# Patient Record
Sex: Female | Born: 1965 | Race: White | Hispanic: No | Marital: Married | Smoking: Never smoker
Health system: Southern US, Community
[De-identification: ages and names within clinical notes are randomized; demographics above are authoritative.]

## PROBLEM LIST (undated history)

## (undated) DIAGNOSIS — Z30430 Encounter for insertion of intrauterine contraceptive device: Secondary | ICD-10-CM

## (undated) DIAGNOSIS — E785 Hyperlipidemia, unspecified: Secondary | ICD-10-CM

## (undated) DIAGNOSIS — T7840XA Allergy, unspecified, initial encounter: Secondary | ICD-10-CM

## (undated) DIAGNOSIS — IMO0002 Reserved for concepts with insufficient information to code with codable children: Secondary | ICD-10-CM

## (undated) DIAGNOSIS — N200 Calculus of kidney: Secondary | ICD-10-CM

## (undated) HISTORY — DX: Calculus of kidney: N20.0

## (undated) HISTORY — DX: Encounter for insertion of intrauterine contraceptive device: Z30.430

## (undated) HISTORY — DX: Reserved for concepts with insufficient information to code with codable children: IMO0002

## (undated) HISTORY — PX: EYE SURGERY: SHX253

## (undated) HISTORY — DX: Hyperlipidemia, unspecified: E78.5

## (undated) HISTORY — DX: Allergy, unspecified, initial encounter: T78.40XA

## (undated) HISTORY — PX: REDUCTION MAMMAPLASTY: SUR839

## (undated) HISTORY — PX: BREAST SURGERY: SHX581

---

## 1988-05-26 DIAGNOSIS — IMO0002 Reserved for concepts with insufficient information to code with codable children: Secondary | ICD-10-CM

## 1988-05-26 HISTORY — DX: Reserved for concepts with insufficient information to code with codable children: IMO0002

## 1988-05-26 HISTORY — PX: COLPOSCOPY: SHX161

## 1997-10-17 ENCOUNTER — Other Ambulatory Visit: Admission: RE | Admit: 1997-10-17 | Discharge: 1997-10-17 | Payer: Self-pay | Admitting: Gynecology

## 1997-11-15 ENCOUNTER — Encounter: Admission: RE | Admit: 1997-11-15 | Discharge: 1998-02-13 | Payer: Self-pay | Admitting: Gynecology

## 1998-03-21 ENCOUNTER — Encounter: Admission: RE | Admit: 1998-03-21 | Discharge: 1998-06-19 | Payer: Self-pay | Admitting: Gynecology

## 1998-05-23 ENCOUNTER — Inpatient Hospital Stay (HOSPITAL_COMMUNITY): Admission: AD | Admit: 1998-05-23 | Discharge: 1998-05-26 | Payer: Self-pay | Admitting: Gynecology

## 1998-07-11 ENCOUNTER — Other Ambulatory Visit: Admission: RE | Admit: 1998-07-11 | Discharge: 1998-07-11 | Payer: Self-pay | Admitting: Gynecology

## 1999-08-27 ENCOUNTER — Other Ambulatory Visit: Admission: RE | Admit: 1999-08-27 | Discharge: 1999-08-27 | Payer: Self-pay | Admitting: Gynecology

## 2001-08-25 ENCOUNTER — Other Ambulatory Visit: Admission: RE | Admit: 2001-08-25 | Discharge: 2001-08-25 | Payer: Self-pay | Admitting: Gynecology

## 2002-11-07 ENCOUNTER — Other Ambulatory Visit: Admission: RE | Admit: 2002-11-07 | Discharge: 2002-11-07 | Payer: Self-pay | Admitting: Gynecology

## 2003-05-30 ENCOUNTER — Inpatient Hospital Stay (HOSPITAL_COMMUNITY): Admission: AD | Admit: 2003-05-30 | Discharge: 2003-06-02 | Payer: Self-pay | Admitting: Gynecology

## 2003-05-31 ENCOUNTER — Encounter (INDEPENDENT_AMBULATORY_CARE_PROVIDER_SITE_OTHER): Payer: Self-pay | Admitting: *Deleted

## 2003-07-28 ENCOUNTER — Other Ambulatory Visit: Admission: RE | Admit: 2003-07-28 | Discharge: 2003-07-28 | Payer: Self-pay | Admitting: Gynecology

## 2004-07-08 ENCOUNTER — Emergency Department (HOSPITAL_COMMUNITY): Admission: EM | Admit: 2004-07-08 | Discharge: 2004-07-08 | Payer: Self-pay | Admitting: Emergency Medicine

## 2004-12-02 ENCOUNTER — Other Ambulatory Visit: Admission: RE | Admit: 2004-12-02 | Discharge: 2004-12-02 | Payer: Self-pay | Admitting: Gynecology

## 2007-10-05 ENCOUNTER — Other Ambulatory Visit: Admission: RE | Admit: 2007-10-05 | Discharge: 2007-10-05 | Payer: Self-pay | Admitting: Gynecology

## 2010-04-11 ENCOUNTER — Ambulatory Visit: Payer: Self-pay | Admitting: Gynecology

## 2010-04-11 ENCOUNTER — Other Ambulatory Visit: Admission: RE | Admit: 2010-04-11 | Discharge: 2010-04-11 | Payer: Self-pay | Admitting: Gynecology

## 2010-04-24 ENCOUNTER — Ambulatory Visit: Payer: Self-pay | Admitting: Gynecology

## 2010-05-16 ENCOUNTER — Ambulatory Visit: Payer: Self-pay | Admitting: Gynecology

## 2010-06-16 ENCOUNTER — Encounter: Payer: Self-pay | Admitting: Gynecology

## 2010-06-18 ENCOUNTER — Ambulatory Visit: Admit: 2010-06-18 | Payer: Self-pay | Admitting: Gynecology

## 2010-07-09 ENCOUNTER — Other Ambulatory Visit: Payer: Self-pay | Admitting: Gynecology

## 2010-07-09 DIAGNOSIS — Z1231 Encounter for screening mammogram for malignant neoplasm of breast: Secondary | ICD-10-CM

## 2010-07-16 ENCOUNTER — Ambulatory Visit
Admission: RE | Admit: 2010-07-16 | Discharge: 2010-07-16 | Disposition: A | Discharge status: Home / Self Care | Payer: BC Managed Care – PPO | Source: Ambulatory Visit | Attending: Gynecology | Admitting: Gynecology

## 2010-07-16 DIAGNOSIS — Z1231 Encounter for screening mammogram for malignant neoplasm of breast: Secondary | ICD-10-CM

## 2010-10-11 NOTE — Discharge Summary (Signed)
Diana Walsh, Diana Walsh                        ACCOUNT NO.:  [id number]   MEDICAL RECORD NO.:  [id number]                   PATIENT TYPE:  INP   LOCATION:  9138                                 FACILITY:  [facility]   PHYSICIAN:  Ivor Costa. Farrel Gobble, M.D.              DATE OF BIRTH:  [birth year]   DATE OF ADMISSION:  05/30/2003  DATE OF DISCHARGE:  06/02/2003                                 DISCHARGE SUMMARY   DISCHARGE DIAGNOSES:  1. Intrauterine pregnancy at 38-and-a-half weeks, delivered.  2. Status post spontaneous vaginal delivery.  3. Advanced maternal age.   HISTORY:  This is a 36-years-of-age female gravida 2 para 1 with an EDC of  June 13, 2003.  Prenatal course had only been complicated by advanced  maternal age and subsequently underwent genetic amniocentesis which reveled  normal chromosomes.   HOSPITAL COURSE:  On May 31, 2003 the patient was admitted at 38+ weeks  in labor.  Artificial rupture of membranes was performed which revealed  meconium.  On May 31, 2003 the patient subsequently underwent a  spontaneous vaginal delivery of a female, Apgars of 8 and 9, weight of 6  pounds 8 ounces, DeLee suctioned at perineum secondary to meconium.  There  was a midline episiotomy which was repaired.  There were no complications.  Postpartum the patient remained afebrile, voiding, and in stable condition,  and on June 02, 2003 the patient was discharged to home in satisfactory  condition and given [facility] Gynecology postpartum instructions.   ACCESSORY CLINICAL FINDINGS/LABORATORY DATA:  The patient is O positive,  rubella immune.  On June 01, 2003 hemoglobin was 9.2.   DISPOSITION:  The patient is discharged to home.  Informed to return to the  office in 6 weeks; if any problem prior to that time to be seen in the  office.     Susa Loffler, P.A.                    Ivor Costa. Farrel Gobble, M.D.    [name]  D:  07/03/2003  T:  07/03/2003  Job:  [id number]

## 2011-12-11 ENCOUNTER — Encounter: Payer: Self-pay | Admitting: Gynecology

## 2011-12-11 DIAGNOSIS — N200 Calculus of kidney: Secondary | ICD-10-CM | POA: Insufficient documentation

## 2011-12-16 ENCOUNTER — Other Ambulatory Visit: Payer: Self-pay | Admitting: Gynecology

## 2011-12-16 DIAGNOSIS — Z1231 Encounter for screening mammogram for malignant neoplasm of breast: Secondary | ICD-10-CM

## 2011-12-18 ENCOUNTER — Other Ambulatory Visit (HOSPITAL_COMMUNITY)
Admission: RE | Admit: 2011-12-18 | Discharge: 2011-12-18 | Disposition: A | Discharge status: Home / Self Care | Payer: BC Managed Care – PPO | Source: Ambulatory Visit | Attending: Gynecology | Admitting: Gynecology

## 2011-12-18 ENCOUNTER — Encounter: Payer: Self-pay | Admitting: Gynecology

## 2011-12-18 ENCOUNTER — Ambulatory Visit (INDEPENDENT_AMBULATORY_CARE_PROVIDER_SITE_OTHER): Payer: BC Managed Care – PPO | Admitting: Gynecology

## 2011-12-18 VITALS — BP 110/70 | Ht 63.0 in | Wt 162.0 lb

## 2011-12-18 DIAGNOSIS — Z1151 Encounter for screening for human papillomavirus (HPV): Secondary | ICD-10-CM | POA: Insufficient documentation

## 2011-12-18 DIAGNOSIS — N879 Dysplasia of cervix uteri, unspecified: Secondary | ICD-10-CM | POA: Insufficient documentation

## 2011-12-18 DIAGNOSIS — Z131 Encounter for screening for diabetes mellitus: Secondary | ICD-10-CM

## 2011-12-18 DIAGNOSIS — Z01419 Encounter for gynecological examination (general) (routine) without abnormal findings: Secondary | ICD-10-CM | POA: Insufficient documentation

## 2011-12-18 DIAGNOSIS — Z13220 Encounter for screening for lipoid disorders: Secondary | ICD-10-CM

## 2011-12-18 DIAGNOSIS — R638 Other symptoms and signs concerning food and fluid intake: Secondary | ICD-10-CM

## 2011-12-18 LAB — CBC WITH DIFFERENTIAL/PLATELET
HCT: 41.4 % (ref 36.0–46.0)
Lymphocytes Relative: 25 % (ref 12–46)
MCV: 91.4 fL (ref 78.0–100.0)
Monocytes Absolute: 0.5 10*3/uL (ref 0.1–1.0)
Monocytes Relative: 6 % (ref 3–12)
Neutro Abs: 5 10*3/uL (ref 1.7–7.7)
Neutrophils Relative %: 65 % (ref 43–77)
Platelets: 332 10*3/uL (ref 150–400)
RBC: 4.53 MIL/uL (ref 3.87–5.11)
WBC: 7.6 10*3/uL (ref 4.0–10.5)

## 2011-12-18 LAB — TSH: TSH: 2.468 u[IU]/mL (ref 0.350–4.500)

## 2011-12-18 LAB — LIPID PANEL
HDL: 52 mg/dL (ref >39)
Triglycerides: 152 mg/dL — ABNORMAL HIGH (ref <150)

## 2011-12-18 LAB — GLUCOSE, RANDOM: Glucose, Bld: 77 mg/dL (ref 70–99)

## 2011-12-18 NOTE — Progress Notes (Signed)
Diana Walsh 1965/08/18 [id number]        46 y.o.  G2P2002 for annual exam.  Doing well no complaints.  Past medical history,surgical history, medications, allergies, family history and social history were all reviewed and documented in the EPIC chart. ROS:  Was performed and pertinent positives and negatives are included in the history.  Exam: Kim assistant Filed Vitals:   12/18/11 0850  BP: 110/70  Height: 5\' 3"  (1.6 m)  Weight: 162 lb (73.483 kg)   General appearance  Normal Skin grossly normal Head/Neck normal with no cervical or supraclavicular adenopathy thyroid normal Lungs  clear Cardiac RR, without RMG Abdominal  soft, nontender, without masses, organomegaly or hernia Breasts  examined lying and sitting without masses, retractions, discharge or axillary adenopathy.  Bilateral well-healed reduction scars. Pelvic  Ext/BUS/vagina  normal   Cervix  normal Pap/HPV, IUD string visualized  Uterus  anteverted, normal size, shape and contour, midline and mobile nontender   Adnexa  Without masses or tenderness    Anus and perineum  normal   Rectovaginal  normal sphincter tone without palpated masses or tenderness.    Assessment/Plan:  46 y.o. [id number] female for annual exam.   1. IUD. Mirena placed 04/2010.  Doing well with scant to absent menses. We'll continue to monitor. 2. Mammography. Patient has scheduled this week. We'll continue with annual mammography. SBE monthly reviewed. 3. Pap smear. History of low-grade SIL 1990. No treatment with normal Pap smears since. Last Pap smear 2011. Pap/HPV done today. Assuming negative and plan every 5 year screening. 4. Weight. Patient complaining of unable to lose weight despite exercise. We'll check TSH at her request. Diet and exercise issues reviewed. 5. Health maintenance. Baseline CBC lipid profile glucose urinalysis ordered. Follow up in one year, sooner as needed.    Dara Lords MD, 9:17 AM 12/18/2011

## 2011-12-18 NOTE — Patient Instructions (Signed)
Office will call you with lab results. Follow up in one year for annual gynecologic exam.

## 2011-12-18 NOTE — Addendum Note (Signed)
Addended by: Dayna Barker on: 12/18/2011 09:23 AM   Modules accepted: Orders

## 2011-12-19 ENCOUNTER — Other Ambulatory Visit: Payer: Self-pay | Admitting: Gynecology

## 2011-12-19 DIAGNOSIS — E7800 Pure hypercholesterolemia, unspecified: Secondary | ICD-10-CM

## 2011-12-19 LAB — URINALYSIS W MICROSCOPIC + REFLEX CULTURE
Bilirubin Urine: NEGATIVE
Casts: NONE SEEN
Hgb urine dipstick: NEGATIVE
Ketones, ur: NEGATIVE mg/dL
Nitrite: NEGATIVE
Protein, ur: NEGATIVE mg/dL

## 2011-12-20 LAB — URINE CULTURE
Colony Count: NO GROWTH
Organism ID, Bacteria: NO GROWTH

## 2011-12-29 ENCOUNTER — Ambulatory Visit: Payer: BC Managed Care – PPO

## 2012-01-02 ENCOUNTER — Ambulatory Visit
Admission: RE | Admit: 2012-01-02 | Discharge: 2012-01-02 | Disposition: A | Discharge status: Home / Self Care | Payer: BC Managed Care – PPO | Source: Ambulatory Visit | Attending: Gynecology | Admitting: Gynecology

## 2012-01-02 DIAGNOSIS — Z1231 Encounter for screening mammogram for malignant neoplasm of breast: Secondary | ICD-10-CM

## 2012-09-11 ENCOUNTER — Ambulatory Visit (INDEPENDENT_AMBULATORY_CARE_PROVIDER_SITE_OTHER): Payer: BC Managed Care – PPO | Admitting: Emergency Medicine

## 2012-09-11 VITALS — BP 122/78 | HR 85 | Temp 98.9°F | Resp 16 | Ht 63.4 in | Wt 160.2 lb

## 2012-09-11 DIAGNOSIS — T148 Other injury of unspecified body region: Secondary | ICD-10-CM

## 2012-09-11 DIAGNOSIS — L0390 Cellulitis, unspecified: Secondary | ICD-10-CM

## 2012-09-11 DIAGNOSIS — L0291 Cutaneous abscess, unspecified: Secondary | ICD-10-CM

## 2012-09-11 DIAGNOSIS — W57XXXA Bitten or stung by nonvenomous insect and other nonvenomous arthropods, initial encounter: Secondary | ICD-10-CM

## 2012-09-11 LAB — POCT CBC
Hemoglobin: 14 g/dL (ref 12.2–16.2)
Lymph, poc: 2 (ref 0.6–3.4)
MCH, POC: 29.6 pg (ref 27–31.2)
Platelet Count, POC: 358 10*3/uL (ref 142–424)
RBC: 4.73 M/uL (ref 4.04–5.48)
RDW, POC: 13 %

## 2012-09-11 MED ORDER — DOXYCYCLINE HYCLATE 100 MG PO CAPS: 100.0000 mg | ORAL_CAPSULE | Freq: Two times a day (BID) | ORAL | Status: DC

## 2012-09-11 MED ORDER — MUPIROCIN 2 % EX OINT: TOPICAL_OINTMENT | Freq: Three times a day (TID) | CUTANEOUS | Status: DC

## 2012-09-11 NOTE — Patient Instructions (Signed)
Cellulitis Cellulitis is an infection of the skin and the tissue beneath it. The infected area is usually red and tender. Cellulitis occurs most often in the arms and lower legs.   CAUSES   Cellulitis is caused by bacteria that enter the skin through cracks or cuts in the skin. The most common types of bacteria that cause cellulitis are Staphylococcus and Streptococcus. SYMPTOMS    Redness and warmth.   Swelling.   Tenderness or pain.   Fever.  DIAGNOSIS  Your caregiver can usually determine what is wrong based on a physical exam. Blood tests may also be done. TREATMENT   Treatment usually involves taking an antibiotic medicine. HOME CARE INSTRUCTIONS    Take your antibiotics as directed. Finish them even if you start to feel better.   Keep the infected arm or leg elevated to reduce swelling.   Apply a warm cloth to the affected area up to 4 times per day to relieve pain.   Only take over-the-counter or prescription medicines for pain, discomfort, or fever as directed by your caregiver.   Keep all follow-up appointments as directed by your caregiver.  SEEK MEDICAL CARE IF:    You notice red streaks coming from the infected area.   Your red area gets larger or turns dark in color.   Your bone or joint underneath the infected area becomes painful after the skin has healed.   Your infection returns in the same area or another area.   You notice a swollen bump in the infected area.   You develop new symptoms.  SEEK IMMEDIATE MEDICAL CARE IF:    You have a fever.   You feel very sleepy.   You develop vomiting or diarrhea.   You have a general ill feeling (malaise) with muscle aches and pains.  MAKE SURE YOU:    Understand these instructions.   Will watch your condition.   Will get help right away if you are not doing well or get worse.  Document Released: 02/19/2005 Document Revised: 11/11/2011 Document Reviewed: 07/28/2011 [employer] Patient Information 2013  [employer].    

## 2012-09-11 NOTE — Progress Notes (Signed)
  Subjective:    Patient ID: Diana Walsh, female    DOB: 30-Dec-1965, 47 y.o.   MRN: [id number]  HPI Pt complains of tick bite on back of left leg. She states she pulled a tick off of her. Today she noticed the area where she removed the tick to be red swollen with blackish material in the center of the    Review of Systems     Objective:   Physical Exam there are 2 abnormal areas just on the underside of the left buttocks. One measures 1 x 0.5 cm the other is about a half by three-quarter centimeter and both are surrounded by a red edematous skin  Results for orders placed in visit on 09/11/12  POCT CBC      Result Value Range   WBC 7.3  4.6 - 10.2 K/uL   Lymph, poc 2.0  0.6 - 3.4   POC LYMPH PERCENT 27.5  10 - 50 %L   MID (cbc) 0.6  0 - 0.9   POC MID % 8.2  0 - 12 %M   POC Granulocyte 4.7  2 - 6.9   Granulocyte percent 64.3  37 - 80 %G   RBC 4.73  4.04 - 5.48 M/uL   Hemoglobin 14.0  12.2 - 16.2 g/dL   HCT, POC 40.9  81.1 - 47.9 %   MCV 93.7  80 - 97 fL   MCH, POC 29.6  27 - 31.2 pg   MCHC 31.6 (*) 31.8 - 35.4 g/dL   RDW, POC 91.4     Platelet Count, POC 358  142 - 424 K/uL   MPV 8.3  0 - 99.8 fL        Assessment & Plan:  The history says this was the area where she removed an engorged tick. The areas looked like spider bites or areas of MRSA. We'll treat the areas with soap and water cleaning as well as Bactroban and doxycycline.

## 2012-09-13 LAB — WOUND CULTURE
Gram Stain: NONE SEEN
Gram Stain: NONE SEEN
Organism ID, Bacteria: NO GROWTH

## 2013-01-03 ENCOUNTER — Other Ambulatory Visit: Payer: Self-pay

## 2013-01-03 DIAGNOSIS — Z1231 Encounter for screening mammogram for malignant neoplasm of breast: Secondary | ICD-10-CM

## 2013-01-20 ENCOUNTER — Ambulatory Visit: Payer: BC Managed Care – PPO

## 2013-01-21 ENCOUNTER — Ambulatory Visit: Payer: BC Managed Care – PPO

## 2013-01-28 ENCOUNTER — Ambulatory Visit
Admission: RE | Admit: 2013-01-28 | Discharge: 2013-01-28 | Disposition: A | Discharge status: Home / Self Care | Payer: BC Managed Care – PPO | Source: Ambulatory Visit

## 2013-01-28 DIAGNOSIS — Z1231 Encounter for screening mammogram for malignant neoplasm of breast: Secondary | ICD-10-CM

## 2013-05-02 ENCOUNTER — Ambulatory Visit (INDEPENDENT_AMBULATORY_CARE_PROVIDER_SITE_OTHER): Payer: BC Managed Care – PPO | Admitting: Emergency Medicine

## 2013-05-02 VITALS — BP 118/76 | HR 78 | Temp 98.0°F | Resp 16 | Ht 62.0 in | Wt 167.0 lb

## 2013-05-02 DIAGNOSIS — H60332 Swimmer's ear, left ear: Secondary | ICD-10-CM

## 2013-05-02 DIAGNOSIS — H6120 Impacted cerumen, unspecified ear: Secondary | ICD-10-CM

## 2013-05-02 DIAGNOSIS — H6122 Impacted cerumen, left ear: Secondary | ICD-10-CM

## 2013-05-02 DIAGNOSIS — H60339 Swimmer's ear, unspecified ear: Secondary | ICD-10-CM

## 2013-05-02 MED ORDER — CIPROFLOXACIN-HYDROCORTISONE 0.2-1 % OT SUSP: 3.0000 [drp] | Freq: Two times a day (BID) | OTIC | Status: DC

## 2013-05-02 NOTE — Patient Instructions (Signed)
Otitis Externa Otitis externa is a bacterial or fungal infection of the outer ear canal. This is the area from the eardrum to the outside of the ear. Otitis externa is sometimes called "swimmer's ear." CAUSES  Possible causes of infection include:  Swimming in dirty water.  Moisture remaining in the ear after swimming or bathing.  Mild injury (trauma) to the ear.  Objects stuck in the ear (foreign body).  Cuts or scrapes (abrasions) on the outside of the ear. SYMPTOMS  The first symptom of infection is often itching in the ear canal. Later signs and symptoms may include swelling and redness of the ear canal, ear pain, and yellowish-white fluid (pus) coming from the ear. The ear pain may be worse when pulling on the earlobe. DIAGNOSIS  Your caregiver will perform a physical exam. A sample of fluid may be taken from the ear and examined for bacteria or fungi. TREATMENT  Antibiotic ear drops are often given for 10 to 14 days. Treatment may also include pain medicine or corticosteroids to reduce itching and swelling. PREVENTION   Keep your ear dry. Use the corner of a towel to absorb water out of the ear canal after swimming or bathing.  Avoid scratching or putting objects inside your ear. This can damage the ear canal or remove the protective wax that lines the canal. This makes it easier for bacteria and fungi to grow.  Avoid swimming in lakes, polluted water, or poorly chlorinated pools.  You may use ear drops made of rubbing alcohol and vinegar after swimming. Combine equal parts of white vinegar and alcohol in a bottle. Put 3 or 4 drops into each ear after swimming. HOME CARE INSTRUCTIONS   Apply antibiotic ear drops to the ear canal as prescribed by your caregiver.  Only take over-the-counter or prescription medicines for pain, discomfort, or fever as directed by your caregiver.  If you have diabetes, follow any additional treatment instructions from your caregiver.  Keep all  follow-up appointments as directed by your caregiver. SEEK MEDICAL CARE IF:   You have a fever.  Your ear is still red, swollen, painful, or draining pus after 3 days.  Your redness, swelling, or pain gets worse.  You have a severe headache.  You have redness, swelling, pain, or tenderness in the area behind your ear. MAKE SURE YOU:   Understand these instructions.  Will watch your condition.  Will get help right away if you are not doing well or get worse. Document Released: 05/12/2005 Document Revised: 08/04/2011 Document Reviewed: 05/29/2011 [employer] Patient Information 2014 [employer].  

## 2013-05-02 NOTE — Progress Notes (Signed)
[facility] [address] [place] [address] [phone]  Date:  05/02/2013   Name:  Diana Walsh   DOB:  [birth year]   MRN:  [id number]  PCP:  Dara Lords, MD    Chief Complaint: Otalgia   History of Present Illness:  Diana Walsh is a 47 y.o. very pleasant female patient who presents with the following:  Has had a left ear ache since last week.  Low grade fever. No nasal congestion or drainage.  Has sore throat.  No nausea or vomiting or rash.  No stool change.  No cough or wheezing.  No coryza.  No improvement with over the counter medications or other home remedies. Denies other complaint or health concern today.   Patient Active Problem List   Diagnosis Date Noted  . Kidney stone     Past Medical History  Diagnosis Date  . Kidney stone   . LGSIL (low grade squamous intraepithelial dysplasia) 1990    normal paps since  . Allergy     Past Surgical History  Procedure Laterality Date  . Mirena      Inserted 05-16-10  . Breast surgery      Reduction  . Eye surgery      Bilateral  . Colposcopy  1990    History  Substance Use Topics  . Smoking status: Never Smoker   . Smokeless tobacco: Not on file  . Alcohol Use: Yes     Comment: Rare    Family History  Problem Relation Age of Onset  . Heart disease Maternal Grandfather   . Heart disease Paternal Grandfather   . Hypertension Paternal Grandfather   . Hypertension Father   . Alzheimer's disease Maternal Grandmother     Allergies  Allergen Reactions  . Codeine   . Penicillins     Medication list has been reviewed and updated.  Current Outpatient Prescriptions on File Prior to Visit  Medication Sig Dispense Refill  . levonorgestrel (MIRENA) 20 MCG/24HR IUD 1 each by Intrauterine route once.      . Multiple Vitamin (MULTIVITAMIN) tablet Take 1 tablet by mouth daily.       No current facility-administered medications on file prior to visit.    Review of  Systems:  As per HPI, otherwise negative.    Physical Examination: Filed Vitals:   05/02/13 1713  BP: 118/76  Pulse: 78  Temp: 98 F (36.7 C)  Resp: 16   Filed Vitals:   05/02/13 1713  Height: 5\' 2"  (1.575 m)  Weight: 167 lb (75.751 kg)   Body mass index is 30.54 kg/(m^2). Ideal Body Weight: Weight in (lb) to have BMI = 25: 136.4  GEN: WDWN, NAD, Non-toxic, A & O x 3 HEENT: Atraumatic, Normocephalic. Neck supple. No masses, No LAD. Ears and Nose: No external deformity.  Left external otitis.  Cerumen impaction left ear CV: RRR, No M/G/R. No JVD. No thrill. No extra heart sounds. PULM: CTA B, no wheezes, crackles, rhonchi. No retractions. No resp. distress. No accessory muscle use. ABD: S, NT, ND, +BS. No rebound. No HSM. EXTR: No c/c/e NEURO Normal gait.  PSYCH: Normally interactive. Conversant. Not depressed or anxious appearing.  Calm demeanor.    Assessment and Plan: Cerumen impaction Left otitis externa  Signed,  Phillips Odor, MD

## 2013-10-06 ENCOUNTER — Ambulatory Visit (INDEPENDENT_AMBULATORY_CARE_PROVIDER_SITE_OTHER): Payer: BC Managed Care – PPO | Admitting: Gynecology

## 2013-10-06 ENCOUNTER — Encounter: Payer: Self-pay | Admitting: Gynecology

## 2013-10-06 VITALS — BP 110/60 | Ht 63.0 in | Wt 144.0 lb

## 2013-10-06 DIAGNOSIS — Z01419 Encounter for gynecological examination (general) (routine) without abnormal findings: Secondary | ICD-10-CM

## 2013-10-06 DIAGNOSIS — L0591 Pilonidal cyst without abscess: Secondary | ICD-10-CM

## 2013-10-06 DIAGNOSIS — Z30431 Encounter for routine checking of intrauterine contraceptive device: Secondary | ICD-10-CM

## 2013-10-06 LAB — CBC WITH DIFFERENTIAL/PLATELET
BASOS ABS: 0.1 10*3/uL (ref 0.0–0.1)
Basophils Relative: 1 % (ref 0–1)
EOS ABS: 0.2 10*3/uL (ref 0.0–0.7)
Eosinophils Relative: 2 % (ref 0–5)
HEMATOCRIT: 39.4 % (ref 36.0–46.0)
Hemoglobin: 13.3 g/dL (ref 12.0–15.0)
LYMPHS PCT: 23 % (ref 12–46)
Lymphs Abs: 2 10*3/uL (ref 0.7–4.0)
MCH: 31.7 pg (ref 26.0–34.0)
MCHC: 33.8 g/dL (ref 30.0–36.0)
MCV: 93.8 fL (ref 78.0–100.0)
MONO ABS: 0.7 10*3/uL (ref 0.1–1.0)
Monocytes Relative: 8 % (ref 3–12)
NEUTROS PCT: 66 % (ref 43–77)
Neutro Abs: 5.7 10*3/uL (ref 1.7–7.7)
Platelets: 299 10*3/uL (ref 150–400)
RBC: 4.2 MIL/uL (ref 3.87–5.11)
RDW: 12.6 % (ref 11.5–15.5)
WBC: 8.6 10*3/uL (ref 4.0–10.5)

## 2013-10-06 NOTE — Progress Notes (Signed)
Diana EnglishMichelle Brisk January 06, 1966 [id number]        48 y.o.  G2P2002 for annual exam.  Several issues noted below.  Past medical history,surgical history, problem list, medications, allergies, family history and social history were all reviewed and documented as reviewed in the EPIC chart.  ROS:  12 system ROS performed with pertinent positives and negatives included in the history, assessment and plan.  Included Systems: General, HEENT, Neck, Cardiovascular, Pulmonary, Gastrointestinal, Genitourinary, Musculoskeletal, Dermatologic, Endocrine, Hematological, Neurologic, Psychiatric Additional significant findings :  None   Exam: Kim assistant Filed Vitals:   10/06/13 1521  BP: 110/60  Height: 5\' 3"  (1.6 m)  Weight: 144 lb (65.318 kg)   General appearance:  Normal affect, orientation and appearance. Skin: Grossly normal HEENT: Without gross lesions.  No cervical or supraclavicular adenopathy. Thyroid normal.  Lungs:  Clear without wheezing, rales or rhonchi Cardiac: RR, without RMG Abdominal:  Soft, nontender, without masses, guarding, rebound, organomegaly or hernia Breasts:  Examined lying and sitting without masses, retractions, discharge or axillary adenopathy. Bilateral well-healed reduction scars Pelvic:  Ext/BUS/vagina normal  Cervix normal with IUD string visualized  Uterus anteverted, normal size, shape and contour, midline and mobile nontender   Adnexa  Without masses or tenderness    Anus and perineum  Normal excepting subcutaneous mobile nodule at end of her sacrum consistent with a pilonidal cyst no overlying skin changes  Rectovaginal  Normal sphincter tone without palpated masses or tenderness.    Assessment/Plan:  48 y.o. 232P2002 female for annual exam with scant to absent menses, Mirena IUD.   1. Mirena IUD 04/2010. Doing well with scant to absent menses. IUD strings visualized. Continue to monitor 2. Subcutaneous nodule at the pilonidal region. Becoming bothersome to  patient with sitting. We'll refer to Gen. surgery to consider excision. 3. Pap smear/HPV negative 2013. No Pap smear done today. History of LGSIL 1990 with normal followup Pap smears. Repeat Pap smear at 3-5 year interval per screening guidelines. 4. Mammography 01/2013. Continue with annual mammography. SBE monthly reviewed. 5. Health maintenance. Baseline CBC comprehensive metabolic panel lipid profile urinalysis ordered. Follow up one year, sooner as needed.  Note: This document was prepared with digital dictation and possible smart phrase technology. Any transcriptional errors that result from this process are unintentional.   Dara Lordsimothy P Sinaya Minogue MD, 3:45 PM 10/06/2013

## 2013-10-06 NOTE — Patient Instructions (Signed)
Office will call you to arrange appointment with general surgery to take care of the pilonidal cyst. Followup in one year for annual exam.  You may obtain a copy of any labs that were done today by logging onto MyChart as outlined in the instructions provided with your AVS (after visit summary). The office will not call with normal lab results but certainly if there are any significant abnormalities then we will contact you.   Health Maintenance, Female A healthy lifestyle and preventative care can promote health and wellness.  Maintain regular health, dental, and eye exams.  Eat a healthy diet. Foods like vegetables, fruits, whole grains, low-fat dairy products, and lean protein foods contain the nutrients you need without too many calories. Decrease your intake of foods high in solid fats, added sugars, and salt. Get information about a proper diet from your caregiver, if necessary.  Regular physical exercise is one of the most important things you can do for your health. Most adults should get at least 150 minutes of moderate-intensity exercise (any activity that increases your heart rate and causes you to sweat) each week. In addition, most adults need muscle-strengthening exercises on 2 or more days a week.   Maintain a healthy weight. The body mass index (BMI) is a screening tool to identify possible weight problems. It provides an estimate of body fat based on height and weight. Your caregiver can help determine your BMI, and can help you achieve or maintain a healthy weight. For adults 20 years and older:  A BMI below 18.5 is considered underweight.  A BMI of 18.5 to 24.9 is normal.  A BMI of 25 to 29.9 is considered overweight.  A BMI of 30 and above is considered obese.  Maintain normal blood lipids and cholesterol by exercising and minimizing your intake of saturated fat. Eat a balanced diet with plenty of fruits and vegetables. Blood tests for lipids and cholesterol should begin  at age 89 and be repeated every 5 years. If your lipid or cholesterol levels are high, you are over 50, or you are a high risk for heart disease, you may need your cholesterol levels checked more frequently.Ongoing high lipid and cholesterol levels should be treated with medicines if diet and exercise are not effective.  If you smoke, find out from your caregiver how to quit. If you do not use tobacco, do not start.  Lung cancer screening is recommended for adults aged 50 80 years who are at high risk for developing lung cancer because of a history of smoking. Yearly low-dose computed tomography (CT) is recommended for people who have at least a 30-pack-year history of smoking and are a current smoker or have quit within the past 15 years. A pack year of smoking is smoking an average of 1 pack of cigarettes a day for 1 year (for example: 1 pack a day for 30 years or 2 packs a day for 15 years). Yearly screening should continue until the smoker has stopped smoking for at least 15 years. Yearly screening should also be stopped for people who develop a health problem that would prevent them from having lung cancer treatment.  If you are pregnant, do not drink alcohol. If you are breastfeeding, be very cautious about drinking alcohol. If you are not pregnant and choose to drink alcohol, do not exceed 1 drink per day. One drink is considered to be 12 ounces (355 mL) of beer, 5 ounces (148 mL) of wine, or 1.5 ounces (44 mL)  of liquor.  Avoid use of street drugs. Do not share needles with anyone. Ask for help if you need support or instructions about stopping the use of drugs.  High blood pressure causes heart disease and increases the risk of stroke. Blood pressure should be checked at least every 1 to 2 years. Ongoing high blood pressure should be treated with medicines, if weight loss and exercise are not effective.  If you are 69 to 46 years old, ask your caregiver if you should take aspirin to prevent  strokes.  Diabetes screening involves taking a blood sample to check your fasting blood sugar level. This should be done once every 3 years, after age 51, if you are within normal weight and without risk factors for diabetes. Testing should be considered at a younger age or be carried out more frequently if you are overweight and have at least 1 risk factor for diabetes.  Breast cancer screening is essential preventative care for women. You should practice "breast self-awareness." This means understanding the normal appearance and feel of your breasts and may include breast self-examination. Any changes detected, no matter how small, should be reported to a caregiver. Women in their 33s and 30s should have a clinical breast exam (CBE) by a caregiver as part of a regular health exam every 1 to 3 years. After age 48, women should have a CBE every year. Starting at age 77, women should consider having a mammogram (breast X-ray) every year. Women who have a family history of breast cancer should talk to their caregiver about genetic screening. Women at a high risk of breast cancer should talk to their caregiver about having an MRI and a mammogram every year.  Breast cancer gene (BRCA)-related cancer risk assessment is recommended for women who have family members with BRCA-related cancers. BRCA-related cancers include breast, ovarian, tubal, and peritoneal cancers. Having family members with these cancers may be associated with an increased risk for harmful changes (mutations) in the breast cancer genes BRCA1 and BRCA2. Results of the assessment will determine the need for genetic counseling and BRCA1 and BRCA2 testing.  The Pap test is a screening test for cervical cancer. Women should have a Pap test starting at age 96. Between ages 29 and 61, Pap tests should be repeated every 2 years. Beginning at age 28, you should have a Pap test every 3 years as long as the past 3 Pap tests have been normal. If you had a  hysterectomy for a problem that was not cancer or a condition that could lead to cancer, then you no longer need Pap tests. If you are between ages 35 and 54, and you have had normal Pap tests going back 10 years, you no longer need Pap tests. If you have had past treatment for cervical cancer or a condition that could lead to cancer, you need Pap tests and screening for cancer for at least 20 years after your treatment. If Pap tests have been discontinued, risk factors (such as a new sexual partner) need to be reassessed to determine if screening should be resumed. Some women have medical problems that increase the chance of getting cervical cancer. In these cases, your caregiver may recommend more frequent screening and Pap tests.  The human papillomavirus (HPV) test is an additional test that may be used for cervical cancer screening. The HPV test looks for the virus that can cause the cell changes on the cervix. The cells collected during the Pap test can be tested  for HPV. The HPV test could be used to screen women aged 58 years and older, and should be used in women of any age who have unclear Pap test results. After the age of 68, women should have HPV testing at the same frequency as a Pap test.  Colorectal cancer can be detected and often prevented. Most routine colorectal cancer screening begins at the age of 73 and continues through age 62. However, your caregiver may recommend screening at an earlier age if you have risk factors for colon cancer. On a yearly basis, your caregiver may provide home test kits to check for hidden blood in the stool. Use of a small camera at the end of a tube, to directly examine the colon (sigmoidoscopy or colonoscopy), can detect the earliest forms of colorectal cancer. Talk to your caregiver about this at age 68, when routine screening begins. Direct examination of the colon should be repeated every 5 to 10 years through age 45, unless early forms of pre-cancerous  polyps or small growths are found.  Hepatitis C blood testing is recommended for all people born from [birth year] through 1963 and any individual with known risks for hepatitis C.  Practice safe sex. Use condoms and avoid high-risk sexual practices to reduce the spread of sexually transmitted infections (STIs). Sexually active women aged 76 and younger should be checked for Chlamydia, which is a common sexually transmitted infection. Older women with new or multiple partners should also be tested for Chlamydia. Testing for other STIs is recommended if you are sexually active and at increased risk.  Osteoporosis is a disease in which the bones lose minerals and strength with aging. This can result in serious bone fractures. The risk of osteoporosis can be identified using a bone density scan. Women ages 103 and over and women at risk for fractures or osteoporosis should discuss screening with their caregivers. Ask your caregiver whether you should be taking a calcium supplement or vitamin D to reduce the rate of osteoporosis.  Menopause can be associated with physical symptoms and risks. Hormone replacement therapy is available to decrease symptoms and risks. You should talk to your caregiver about whether hormone replacement therapy is right for you.  Use sunscreen. Apply sunscreen liberally and repeatedly throughout the day. You should seek shade when your shadow is shorter than you. Protect yourself by wearing long sleeves, pants, a wide-brimmed hat, and sunglasses year round, whenever you are outdoors.  Notify your caregiver of new moles or changes in moles, especially if there is a change in shape or color. Also notify your caregiver if a mole is larger than the size of a pencil eraser.  Stay current with your immunizations. Document Released: 11/25/2010 Document Revised: 09/06/2012 Document Reviewed: 11/25/2010 [facility] Patient Information 2014 [place].

## 2013-10-07 ENCOUNTER — Telehealth: Payer: Self-pay | Admitting: *Deleted

## 2013-10-07 DIAGNOSIS — L0591 Pilonidal cyst without abscess: Secondary | ICD-10-CM

## 2013-10-07 LAB — COMPREHENSIVE METABOLIC PANEL
ALK PHOS: 80 U/L (ref 39–117)
ALT: 13 U/L (ref 0–35)
AST: 13 U/L (ref 0–37)
Albumin: 4.1 g/dL (ref 3.5–5.2)
BILIRUBIN TOTAL: 0.4 mg/dL (ref 0.2–1.2)
BUN: 11 mg/dL (ref 6–23)
CALCIUM: 9.4 mg/dL (ref 8.4–10.5)
CO2: 26 mEq/L (ref 19–32)
CREATININE: 0.76 mg/dL (ref 0.50–1.10)
Chloride: 104 mEq/L (ref 96–112)
Glucose, Bld: 99 mg/dL (ref 70–99)
POTASSIUM: 4.2 meq/L (ref 3.5–5.3)
SODIUM: 140 meq/L (ref 135–145)
Total Protein: 6.3 g/dL (ref 6.0–8.3)

## 2013-10-07 LAB — LIPID PANEL
CHOLESTEROL: 187 mg/dL (ref 0–200)
HDL: 51 mg/dL (ref >39)
LDL CALC: 104 mg/dL — AB (ref 0–99)
TRIGLYCERIDES: 162 mg/dL — AB (ref <150)
Total CHOL/HDL Ratio: 3.7 Ratio
VLDL: 32 mg/dL (ref 0–40)

## 2013-10-07 LAB — URINALYSIS W MICROSCOPIC + REFLEX CULTURE
BACTERIA UA: NONE SEEN
Bilirubin Urine: NEGATIVE
Casts: NONE SEEN
Crystals: NONE SEEN
GLUCOSE, UA: NEGATIVE mg/dL
Ketones, ur: NEGATIVE mg/dL
NITRITE: NEGATIVE
PH: 7 (ref 5.0–8.0)
PROTEIN: NEGATIVE mg/dL
SPECIFIC GRAVITY, URINE: 1.012 (ref 1.005–1.030)
Squamous Epithelial / LPF: NONE SEEN
Urobilinogen, UA: 0.2 mg/dL (ref 0.0–1.0)

## 2013-10-07 NOTE — Telephone Encounter (Signed)
Message copied by Aura CampsWEBB, JENNIFER L on Fri Oct 07, 2013  9:36 AM ------      Message from: Dara LordsFONTAINE, TIMOTHY P      Created: Thu Oct 06, 2013  3:50 PM       Patient needs appointment with general surgeon in reference to uncomfortable pilonidal cyst ------

## 2013-10-07 NOTE — Telephone Encounter (Signed)
Referral placed.

## 2013-10-10 LAB — URINE CULTURE: Colony Count: 60000

## 2013-10-11 ENCOUNTER — Other Ambulatory Visit: Payer: Self-pay | Admitting: Gynecology

## 2013-10-11 DIAGNOSIS — N390 Urinary tract infection, site not specified: Secondary | ICD-10-CM

## 2013-10-11 MED ORDER — NITROFURANTOIN MONOHYD MACRO 100 MG PO CAPS: 100.0000 mg | ORAL_CAPSULE | Freq: Two times a day (BID) | ORAL | Status: DC

## 2013-10-11 NOTE — Telephone Encounter (Signed)
Appointment on 10/24/13 @ 2:10 with Dr.Ramirez, left detailed message on pt voicemail.

## 2013-10-19 NOTE — Telephone Encounter (Signed)
Pt aware of appointment rescheduled for 11/04/13

## 2013-10-24 ENCOUNTER — Ambulatory Visit (INDEPENDENT_AMBULATORY_CARE_PROVIDER_SITE_OTHER): Payer: Self-pay | Admitting: General Surgery

## 2013-11-04 ENCOUNTER — Ambulatory Visit (INDEPENDENT_AMBULATORY_CARE_PROVIDER_SITE_OTHER): Payer: Self-pay | Admitting: General Surgery

## 2014-03-27 ENCOUNTER — Encounter: Payer: Self-pay | Admitting: Gynecology

## 2015-07-05 ENCOUNTER — Other Ambulatory Visit: Payer: Self-pay

## 2015-07-05 DIAGNOSIS — Z1231 Encounter for screening mammogram for malignant neoplasm of breast: Secondary | ICD-10-CM

## 2015-07-27 ENCOUNTER — Ambulatory Visit
Admission: RE | Admit: 2015-07-27 | Discharge: 2015-07-27 | Disposition: A | Discharge status: Home / Self Care | Payer: BC Managed Care – PPO | Source: Ambulatory Visit

## 2015-07-27 DIAGNOSIS — Z1231 Encounter for screening mammogram for malignant neoplasm of breast: Secondary | ICD-10-CM

## 2015-09-05 ENCOUNTER — Ambulatory Visit (INDEPENDENT_AMBULATORY_CARE_PROVIDER_SITE_OTHER): Payer: BC Managed Care – PPO | Admitting: Family Medicine

## 2015-09-05 VITALS — BP 116/72 | HR 63 | Temp 98.0°F | Resp 18 | Ht 63.0 in | Wt 159.0 lb

## 2015-09-05 DIAGNOSIS — J069 Acute upper respiratory infection, unspecified: Secondary | ICD-10-CM | POA: Diagnosis not present

## 2015-09-05 DIAGNOSIS — M791 Myalgia, unspecified site: Secondary | ICD-10-CM

## 2015-09-05 DIAGNOSIS — J302 Other seasonal allergic rhinitis: Secondary | ICD-10-CM | POA: Diagnosis not present

## 2015-09-05 DIAGNOSIS — R070 Pain in throat: Secondary | ICD-10-CM | POA: Diagnosis not present

## 2015-09-05 DIAGNOSIS — R05 Cough: Secondary | ICD-10-CM | POA: Diagnosis not present

## 2015-09-05 LAB — POCT INFLUENZA A/B
INFLUENZA B, POC: NEGATIVE
Influenza A, POC: NEGATIVE

## 2015-09-05 MED ORDER — LORATADINE 10 MG PO TABS: 10.0000 mg | ORAL_TABLET | Freq: Every day | ORAL | Status: DC

## 2015-09-05 MED ORDER — FLUTICASONE PROPIONATE 50 MCG/ACT NA SUSP: 2.0000 | Freq: Every day | NASAL | Status: DC

## 2015-09-05 MED ORDER — HYDROCOD POLST-CPM POLST ER 10-8 MG/5ML PO SUER: 5.0000 mL | Freq: Two times a day (BID) | ORAL | Status: DC | PRN

## 2015-09-05 NOTE — Patient Instructions (Addendum)
I recommend frequent warm salt water gargles, hot tea with honey and lemon, rest, and handwashing.  Hot showers or breathing in steam may help loosen the congestion.  Try a netti pot or sinus rinse is also likely to help you feel better and keep this from progressing.  Try adding in some mucinex D and/or Afrin for 3 days only to open up your airways and decrease the post-nasal drip.     IF you received an x-ray today, you will receive an invoice from [facility]. Please contact [facility] at [phone] with questions or concerns regarding your invoice.   IF you received labwork today, you will receive an invoice from [facility]. Please contact [facility] at [phone] with questions or concerns regarding your invoice.   Our billing staff will not be able to assist you with questions regarding bills from these companies.  You will be contacted with the lab results as soon as they are available. The fastest way to get your results is to activate your My Chart account. Instructions are located on the last page of this paperwork. If you have not heard from us regarding the results in 2 weeks, please contact this office.    Upper Respiratory Infection, Adult Most upper respiratory infections (URIs) are a viral infection of the air passages leading to the lungs. A URI affects the nose, throat, and upper air passages. The most common type of URI is nasopharyngitis and is typically referred to as "the common cold." URIs run their course and usually go away on their own. Most of the time, a URI does not require medical attention, but sometimes a bacterial infection in the upper airways can follow a viral infection. This is called a secondary infection. Sinus and middle ear infections are common types of secondary upper respiratory infections. Bacterial pneumonia can also complicate a URI. A URI can worsen asthma and chronic obstructive pulmonary disease (COPD).  Sometimes, these complications can require emergency medical care and may be life threatening.  CAUSES Almost all URIs are caused by viruses. A virus is a type of germ and can spread from one person to another.  RISKS FACTORS You may be at risk for a URI if:   You smoke.   You have chronic heart or lung disease.  You have a weakened defense (immune) system.   You are very young or very old.   You have nasal allergies or asthma.  You work in crowded or poorly ventilated areas.  You work in health care facilities or schools. SIGNS AND SYMPTOMS  Symptoms typically develop 2-3 days after you come in contact with a cold virus. Most viral URIs last 7-10 days. However, viral URIs from the influenza virus (flu virus) can last 14-18 days and are typically more severe. Symptoms may include:   Runny or stuffy (congested) nose.   Sneezing.   Cough.   Sore throat.   Headache.   Fatigue.   Fever.   Loss of appetite.   Pain in your forehead, behind your eyes, and over your cheekbones (sinus pain).  Muscle aches.  DIAGNOSIS  Your health care provider may diagnose a URI by:  Physical exam.  Tests to check that your symptoms are not due to another condition such as:  Strep throat.  Sinusitis.  Pneumonia.  Asthma. TREATMENT  A URI goes away on its own with time. It cannot be cured with medicines, but medicines may be prescribed or recommended to relieve symptoms. Medicines may help:  Reduce your fever.  Reduce your cough.  Relieve nasal congestion. HOME CARE INSTRUCTIONS   Take medicines only as directed by your health care provider.   Gargle warm saltwater or take cough drops to comfort your throat as directed by your health care provider.  Use a warm mist humidifier or inhale steam from a shower to increase air moisture. This may make it easier to breathe.  Drink enough fluid to keep your urine clear or pale yellow.   Eat soups and other clear  broths and maintain good nutrition.   Rest as needed.   Return to work when your temperature has returned to normal or as your health care provider advises. You may need to stay home longer to avoid infecting others. You can also use a face mask and careful hand washing to prevent spread of the virus.  Increase the usage of your inhaler if you have asthma.   Do not use any tobacco products, including cigarettes, chewing tobacco, or electronic cigarettes. If you need help quitting, ask your health care provider. PREVENTION  The best way to protect yourself from getting a cold is to practice good hygiene.   Avoid oral or hand contact with people with cold symptoms.   Wash your hands often if contact occurs.  There is no clear evidence that vitamin C, vitamin E, echinacea, or exercise reduces the chance of developing a cold. However, it is always recommended to get plenty of rest, exercise, and practice good nutrition.  SEEK MEDICAL CARE IF:   You are getting worse rather than better.   Your symptoms are not controlled by medicine.   You have chills.  You have worsening shortness of breath.  You have brown or red mucus.  You have yellow or brown nasal discharge.  You have pain in your face, especially when you bend forward.  You have a fever.  You have swollen neck glands.  You have pain while swallowing.  You have white areas in the back of your throat. SEEK IMMEDIATE MEDICAL CARE IF:   You have severe or persistent:  Headache.  Ear pain.  Sinus pain.  Chest pain.  You have chronic lung disease and any of the following:  Wheezing.  Prolonged cough.  Coughing up blood.  A change in your usual mucus.  You have a stiff neck.  You have changes in your:  Vision.  Hearing.  Thinking.  Mood. MAKE SURE YOU:   Understand these instructions.  Will watch your condition.  Will get help right away if you are not doing well or get worse.   This  information is not intended to replace advice given to you by your health care provider. Make sure you discuss any questions you have with your health care provider.   Document Released: 11/05/2000 Document Revised: 09/26/2014 Document Reviewed: 08/17/2013 Elsevier Interactive Patient Education [employer]

## 2015-09-05 NOTE — Progress Notes (Signed)
By signing my name below, I, Mesha Guiyard, attest that this documentation has been prepared under the direction and in the presence of Norberto SorensonEva Ravon Mcilhenny, MD.  Electronically Signed: Arvilla MarketMesha Guinyard, Medical Scribe. 09/05/2015. 10:30 AM.  Subjective:    Patient ID: Diana Walsh, female    DOB: 18-Oct-1965, 50 y.o.   MRN: [id number]  HPI Chief Complaint  Patient presents with  . Sore Throat  . Generalized Body Aches    HPI Comments: Diana Walsh is a 50 y.o. female who presents to the [facility] complaining of sore throat with sudden onset yesteray. Pt reports sore throat radiates to her ears. Pt reports associated symptoms of dry cough, and body aches. Pt reports taking tylenol for aches and pain, and some over the counter medication for cough. Pt reports trouble sleeping due to body aches. Pt has recieved flu shot this year. Pt also reports sick contacts at home. Pt denies fevers, chills, diaphoresis, and no nasail congestion. Pt takes Flonase and calriten for seasonal allergies.   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  Patient Active Problem List   Diagnosis Date Noted  . Kidney stone    Past Medical History  Diagnosis Date  . Kidney stone   . LGSIL (low grade squamous intraepithelial dysplasia) 1990    normal paps since  . Allergy    Past Surgical History  Procedure Laterality Date  . Mirena      Inserted 05-16-10  . Breast surgery      Reduction  . Eye surgery      Bilateral  . Colposcopy  1990   Allergies  Allergen Reactions  . Penicillins Nausea And Vomiting  . Codeine Palpitations   Prior to Admission medications   Medication Sig Start Date End Date Taking? Authorizing Provider  levonorgestrel (MIRENA) 20 MCG/24HR IUD 1 each by Intrauterine route once. Reported on 09/05/2015    Historical Provider, MD  Multiple Vitamin (MULTIVITAMIN) tablet Take 1 tablet by mouth daily. Reported on 09/05/2015     Historical Provider, MD  nitrofurantoin, macrocrystal-monohydrate, (MACROBID) 100 MG capsule Take 1 capsule (100 mg total) by mouth 2 (two) times daily. Patient not taking: Reported on 09/05/2015 10/11/13   Dara Lordsimothy P Fontaine, MD   Social History   Social History  . Marital Status: Married    Spouse Name: N/A  . Number of Children: N/A  . Years of Education: N/A   Occupational History  . Not on file.   Social History Main Topics  . Smoking status: Never Smoker   . Smokeless tobacco: Not on file  . Alcohol Use: Yes     Comment: Rare  . Drug Use: No  . Sexual Activity: Yes    Birth Control/ Protection: IUD     Comment: MIRENA inserted 05-16-10   Other Topics Concern  . Not on file   Social History Narrative   Review of Systems  Constitutional: Negative for fever, chills and diaphoresis.  HENT: Positive for ear pain, postnasal drip and sore throat. Negative for congestion and sinus pressure.   Respiratory: Positive for cough.   Musculoskeletal: Positive for myalgias.  Allergic/Immunologic: Positive for environmental allergies.  Psychiatric/Behavioral: Positive for sleep disturbance.      Objective:   Physical Exam  Constitutional: She appears well-developed and well-nourished. No distress.  HENT:  Head: Normocephalic and atraumatic.  Right Ear: Tympanic membrane is retracted.  Left Ear: Tympanic membrane is injected and retracted.  Nose:  Mucosal edema (left worse than right) present.  Throat with clear postnasal drainage.  Eyes: Conjunctivae are normal.  Neck: Neck supple.  Cardiovascular: Normal rate, regular rhythm and normal heart sounds.   Pulmonary/Chest: Effort normal and breath sounds normal. No respiratory distress.  Neurological: She is alert.  Skin: Skin is warm and dry.  Psychiatric: She has a normal mood and affect. Her behavior is normal.  Nursing note and vitals reviewed.  Filed Vitals:   09/05/15 1025  BP: 116/72  Pulse: 63  Temp: 98 F (36.7  C)  TempSrc: Oral  Resp: 18  Height:  (1.6 m)  Weight: 159 lb (72.122 kg)  SpO2: 98%   Results for orders placed or performed in visit on 09/05/15  POCT Influenza A/B  Result Value Ref Range   Influenza A, POC Negative Negative   Influenza B, POC Negative Negative    Assessment & Plan:   1. Myalgia   2. Acute URI   3. Seasonal allergies     Orders Placed This Encounter  Procedures  . POCT Influenza A/B    Meds ordered this encounter  Medications  . chlorpheniramine-HYDROcodone (TUSSIONEX PENNKINETIC ER) 10-8 MG/5ML SUER    Sig: Take 5 mLs by mouth every 12 (twelve) hours as needed.    Dispense:  120 mL    Refill:  0  . loratadine (CLARITIN) 10 MG tablet    Sig: Take 1 tablet (10 mg total) by mouth daily.    Dispense:  30 tablet    Refill:  3  . fluticasone (FLONASE) 50 MCG/ACT nasal spray    Sig: Place 2 sprays into both nostrils at bedtime.    Dispense:  16 g    Refill:  2    I personally performed the services described in this documentation, which was scribed in my presence. The recorded information has been reviewed and considered, and addended by me as needed.  Norberto Sorenson, MD MPH

## 2015-10-01 ENCOUNTER — Encounter (HOSPITAL_COMMUNITY): Payer: Self-pay

## 2015-10-01 ENCOUNTER — Emergency Department (HOSPITAL_COMMUNITY): Payer: BC Managed Care – PPO

## 2015-10-01 ENCOUNTER — Observation Stay (HOSPITAL_COMMUNITY)
Admission: EM | Admit: 2015-10-01 | Discharge: 2015-10-02 | Disposition: A | Discharge status: Home / Self Care | Payer: BC Managed Care – PPO | Attending: Internal Medicine | Admitting: Internal Medicine

## 2015-10-01 DIAGNOSIS — E872 Acidosis: Secondary | ICD-10-CM | POA: Insufficient documentation

## 2015-10-01 DIAGNOSIS — N200 Calculus of kidney: Secondary | ICD-10-CM | POA: Insufficient documentation

## 2015-10-01 DIAGNOSIS — Z87442 Personal history of urinary calculi: Secondary | ICD-10-CM | POA: Insufficient documentation

## 2015-10-01 DIAGNOSIS — R6510 Systemic inflammatory response syndrome (SIRS) of non-infectious origin without acute organ dysfunction: Secondary | ICD-10-CM | POA: Insufficient documentation

## 2015-10-01 DIAGNOSIS — E860 Dehydration: Secondary | ICD-10-CM | POA: Insufficient documentation

## 2015-10-01 DIAGNOSIS — Z79899 Other long term (current) drug therapy: Secondary | ICD-10-CM | POA: Insufficient documentation

## 2015-10-01 DIAGNOSIS — R197 Diarrhea, unspecified: Secondary | ICD-10-CM | POA: Diagnosis not present

## 2015-10-01 DIAGNOSIS — R1110 Vomiting, unspecified: Secondary | ICD-10-CM

## 2015-10-01 DIAGNOSIS — A419 Sepsis, unspecified organism: Secondary | ICD-10-CM

## 2015-10-01 DIAGNOSIS — R112 Nausea with vomiting, unspecified: Secondary | ICD-10-CM | POA: Insufficient documentation

## 2015-10-01 DIAGNOSIS — R509 Fever, unspecified: Secondary | ICD-10-CM | POA: Diagnosis present

## 2015-10-01 LAB — URINALYSIS, ROUTINE W REFLEX MICROSCOPIC
BILIRUBIN URINE: NEGATIVE
GLUCOSE, UA: NEGATIVE mg/dL
KETONES UR: NEGATIVE mg/dL
Nitrite: NEGATIVE
PH: 6 (ref 5.0–8.0)
Protein, ur: NEGATIVE mg/dL
SPECIFIC GRAVITY, URINE: 1.01 (ref 1.005–1.030)

## 2015-10-01 LAB — URINE MICROSCOPIC-ADD ON

## 2015-10-01 LAB — CBC WITH DIFFERENTIAL/PLATELET
Basophils Absolute: 0 10*3/uL (ref 0.0–0.1)
Basophils Relative: 0 %
Eosinophils Absolute: 0.1 10*3/uL (ref 0.0–0.7)
Eosinophils Relative: 1 %
HEMATOCRIT: 42.4 % (ref 36.0–46.0)
HEMOGLOBIN: 14.1 g/dL (ref 12.0–15.0)
LYMPHS PCT: 2 %
Lymphs Abs: 0.2 10*3/uL — ABNORMAL LOW (ref 0.7–4.0)
MCH: 30.5 pg (ref 26.0–34.0)
MCHC: 33.3 g/dL (ref 30.0–36.0)
MCV: 91.6 fL (ref 78.0–100.0)
MONO ABS: 0.2 10*3/uL (ref 0.1–1.0)
MONOS PCT: 2 %
NEUTROS ABS: 8.6 10*3/uL — AB (ref 1.7–7.7)
NEUTROS PCT: 95 %
Platelets: 217 10*3/uL (ref 150–400)
RBC: 4.63 MIL/uL (ref 3.87–5.11)
RDW: 12.6 % (ref 11.5–15.5)
WBC: 9 10*3/uL (ref 4.0–10.5)

## 2015-10-01 LAB — COMPREHENSIVE METABOLIC PANEL
ALBUMIN: 4.2 g/dL (ref 3.5–5.0)
ALT: 39 U/L (ref 14–54)
ANION GAP: 11 (ref 5–15)
AST: 31 U/L (ref 15–41)
Alkaline Phosphatase: 70 U/L (ref 38–126)
BUN: 15 mg/dL (ref 6–20)
CHLORIDE: 105 mmol/L (ref 101–111)
CO2: 22 mmol/L (ref 22–32)
Calcium: 9 mg/dL (ref 8.9–10.3)
Creatinine, Ser: 0.89 mg/dL (ref 0.44–1.00)
GFR calc Af Amer: >60 mL/min (ref >60)
GFR calc non Af Amer: >60 mL/min (ref >60)
GLUCOSE: 122 mg/dL — AB (ref 65–99)
POTASSIUM: 4 mmol/L (ref 3.5–5.1)
SODIUM: 138 mmol/L (ref 135–145)
Total Bilirubin: 0.8 mg/dL (ref 0.3–1.2)
Total Protein: 7.1 g/dL (ref 6.5–8.1)

## 2015-10-01 LAB — I-STAT BETA HCG BLOOD, ED (MC, WL, AP ONLY)

## 2015-10-01 LAB — I-STAT CG4 LACTIC ACID, ED: Lactic Acid, Venous: 2.25 mmol/L (ref 0.5–2.0)

## 2015-10-01 MED ORDER — IOPAMIDOL (ISOVUE-300) INJECTION 61%
100.0000 mL | Freq: Once | INTRAVENOUS | Status: AC | PRN
  Administered 2015-10-01: 100 mL via INTRAVENOUS

## 2015-10-01 MED ORDER — KETOROLAC TROMETHAMINE 30 MG/ML IJ SOLN
30.0000 mg | Freq: Once | INTRAMUSCULAR | Status: AC
  Administered 2015-10-01: 30 mg via INTRAVENOUS
  Filled 2015-10-01: qty 1

## 2015-10-01 MED ORDER — ACETAMINOPHEN 325 MG PO TABS
650.0000 mg | ORAL_TABLET | Freq: Once | ORAL | Status: AC | PRN
  Administered 2015-10-01: 650 mg via ORAL
  Filled 2015-10-01: qty 2

## 2015-10-01 MED ORDER — SODIUM CHLORIDE 0.9 % IV BOLUS (SEPSIS)
1000.0000 mL | Freq: Once | INTRAVENOUS | Status: AC
  Administered 2015-10-01: 1000 mL via INTRAVENOUS

## 2015-10-01 MED ORDER — ONDANSETRON 4 MG PO TBDP
4.0000 mg | ORAL_TABLET | Freq: Once | ORAL | Status: AC
  Administered 2015-10-01: 4 mg via ORAL
  Filled 2015-10-01: qty 1

## 2015-10-01 MED ORDER — SODIUM CHLORIDE 0.9 % IV BOLUS (SEPSIS)
250.0000 mL | Freq: Once | INTRAVENOUS | Status: AC
  Administered 2015-10-02: 02:00:00 via INTRAVENOUS

## 2015-10-01 NOTE — ED Notes (Signed)
Pt complains of n,v, d, and fever on Friday was a little better over the weekend, today she came home and she had the chills, vomiting, diarrhea, and a fever

## 2015-10-01 NOTE — ED Provider Notes (Signed)
CSN: [id number]     Arrival date & time 10/01/15  1916 History   First MD Initiated Contact with Patient 10/01/15 2027     Chief Complaint  Patient presents with  . Fever     (Consider location/radiation/quality/duration/timing/severity/associated sxs/prior Treatment) HPI Comments: 50 year old female with no significant past dental history presents to the emergency department for evaluation of nausea, vomiting, and diarrhea. She reports that symptoms began 4 days ago with approximately 3 episodes of emesis and 3 episodes of diarrhea. She reports that she had body aches throughout the day as well in addition to complaints of chills. She initially thought that her symptoms were food related as she had eaten some peaked a guy oh that her husband had not eaten. Symptoms associated with generalized abdominal cramping. Chills and body aches continued into Friday. Patient thought her symptoms were resolving on Saturday and Sunday. She states that her vomiting and diarrhea returned today with chills and abdominal cramping. She has not taken any medications for her symptoms. She has tried eating crackers and sipping ginger ale. No history of abdominal surgeries or sick contacts. Patient was noted to have a fever of 102.38F in triage. She noted a fever of 102.65F prior to arrival. Patient denies dysuria, hematuria, vaginal complaints, hematemesis, melena, hematochezia. No chest pain or shortness of breath. LMP unknown; patient has Mirena and does not have regular menses because of this.  Patient is a 50 y.o. female presenting with fever. The history is provided by the patient. No language interpreter was used.  Fever Associated symptoms: chills, diarrhea, nausea and vomiting   Associated symptoms: no chest pain and no dysuria     Past Medical History  Diagnosis Date  . Kidney stone   . LGSIL (low grade squamous intraepithelial dysplasia) 1990    normal paps since  . Allergy    Past Surgical History   Procedure Laterality Date  . Mirena      Inserted 05-16-10  . Breast surgery      Reduction  . Eye surgery      Bilateral  . Colposcopy  1990   Family History  Problem Relation Age of Onset  . Heart disease Maternal Grandfather   . Heart disease Paternal Grandfather   . Hypertension Paternal Grandfather   . Hypertension Father   . Alzheimer's disease Maternal Grandmother    Social History  Substance Use Topics  . Smoking status: Never Smoker   . Smokeless tobacco: None  . Alcohol Use: Yes     Comment: Rare   OB History    Gravida Para Term Preterm AB TAB SAB Ectopic Multiple Living   2 2 2       2       Review of Systems  Constitutional: Positive for fever and chills.  Respiratory: Negative for shortness of breath.   Cardiovascular: Negative for chest pain.  Gastrointestinal: Positive for nausea, vomiting, abdominal pain and diarrhea.  Genitourinary: Negative for dysuria and hematuria.    Allergies  Penicillins and Codeine  Home Medications   Prior to Admission medications   Medication Sig Start Date End Date Taking? Authorizing Provider  bismuth subsalicylate (PEPTO BISMOL) 262 MG chewable tablet Chew 524 mg by mouth daily as needed for indigestion.   Yes Historical Provider, MD  fluticasone (FLONASE) 50 MCG/ACT nasal spray Place 2 sprays into both nostrils at bedtime. Patient taking differently: Place 2 sprays into both nostrils daily as needed for allergies or rhinitis.  09/05/15  Yes Sherren Mocha,  MD  levonorgestrel (MIRENA) 20 MCG/24HR IUD 1 each by Intrauterine route once. Reported on 09/05/2015   Yes Historical Provider, MD  loratadine (CLARITIN) 10 MG tablet Take 1 tablet (10 mg total) by mouth daily. Patient taking differently: Take 10 mg by mouth daily as needed for allergies.  09/05/15  Yes Sherren Mocha, MD  Multiple Vitamin (MULTIVITAMIN) tablet Take 1 tablet by mouth daily. Reported on 09/05/2015   Yes Historical Provider, MD  pseudoephedrine (SUDAFED) 120 MG  12 hr tablet Take 120 mg by mouth 2 (two) times daily as needed for congestion.   Yes Historical Provider, MD  chlorpheniramine-HYDROcodone (TUSSIONEX PENNKINETIC ER) 10-8 MG/5ML SUER Take 5 mLs by mouth every 12 (twelve) hours as needed. Patient not taking: Reported on 10/01/2015 09/05/15   Sherren Mocha, MD   BP 113/71 mmHg  Pulse 102  Temp(Src) 100.8 F (38.2 C) (Oral)  Resp 18  Ht 5\' 2"  (1.575 m)  Wt 67.132 kg  BMI 27.06 kg/m2  SpO2 97%   Physical Exam  Constitutional: She is oriented to person, place, and time. She appears well-developed and well-nourished. No distress.  Nontoxic appearing Fever now 100.60F at bedside.  HENT:  Head: Normocephalic and atraumatic.  Eyes: Conjunctivae and EOM are normal. No scleral icterus.  Neck: Normal range of motion.  No nuchal rigidity or meningismus.  Cardiovascular: Regular rhythm and intact distal pulses.   Tachycardia to 106 bpm  Pulmonary/Chest: Effort normal and breath sounds normal. No respiratory distress. She has no wheezes. She has no rales.  Lungs CTAB  Abdominal: Soft. She exhibits no distension. There is tenderness. There is no rebound and no guarding.  Soft abdomen with mild TTP in the RLQ. No masses, guarding, or rigidity. Bowel sounds are normoactive. No peritoneal signs.  Musculoskeletal: Normal range of motion.  Neurological: She is alert and oriented to person, place, and time. She exhibits normal muscle tone. Coordination normal.  GCS 15. Patient moving all extremities.  Skin: Skin is warm and dry. No rash noted. She is not diaphoretic. No erythema. No pallor.  Psychiatric: She has a normal mood and affect. Her behavior is normal.  Nursing note and vitals reviewed.   ED Course  Procedures (including critical care time) Labs Review Labs Reviewed  COMPREHENSIVE METABOLIC PANEL - Abnormal; Notable for the following:    Glucose, Bld 122 (*)    All other components within normal limits  CBC WITH DIFFERENTIAL/PLATELET -  Abnormal; Notable for the following:    Neutro Abs 8.6 (*)    Lymphs Abs 0.2 (*)    All other components within normal limits  URINALYSIS, ROUTINE W REFLEX MICROSCOPIC (NOT AT [facility]) - Abnormal; Notable for the following:    Hgb urine dipstick SMALL (*)    Leukocytes, UA TRACE (*)    All other components within normal limits  URINE MICROSCOPIC-ADD ON - Abnormal; Notable for the following:    Squamous Epithelial / LPF 6-30 (*)    Bacteria, UA FEW (*)    All other components within normal limits  I-STAT CG4 LACTIC ACID, ED - Abnormal; Notable for the following:    Lactic Acid, Venous 2.25 (*)    All other components within normal limits  I-STAT CG4 LACTIC ACID, ED - Abnormal; Notable for the following:    Lactic Acid, Venous 2.66 (*)    All other components within normal limits  CULTURE, BLOOD (ROUTINE X 2)  CULTURE, BLOOD (ROUTINE X 2)  URINE CULTURE  I-STAT BETA HCG BLOOD,  ED ([facility], [facility], [facility] ONLY)    Imaging Review Ct Abdomen Pelvis W Contrast  10/01/2015  CLINICAL DATA:  Nausea vomiting and fever several days ago, recurrent today. EXAM: CT ABDOMEN AND PELVIS WITH CONTRAST TECHNIQUE: Multidetector CT imaging of the abdomen and pelvis was performed using the standard protocol following bolus administration of intravenous contrast. CONTRAST:  100mL ISOVUE-300 IOPAMIDOL (ISOVUE-300) INJECTION 61% COMPARISON:  None. FINDINGS: Lower chest:  No significant abnormality Hepatobiliary: There are normal appearances of the liver, gallbladder and bile ducts. Pancreas: Normal Spleen: Norm Adrenals/Urinary Tract: Both adrenals are normal. No significant renal parenchymal lesions. There are collecting system calculi bilaterally, measuring up to 10 mm in the right lower pole and up to 7 mm in the left lower pole. No ureteral calculi. Urinary bladder is nearly completely empty, without obvious abnormality. Stomach/Bowel: There are normal appearances of the stomach, small bowel and colon. The appendix is normal.  Vascular/Lymphatic: The abdominal aorta is normal in caliber. There is no atherosclerotic calcification. There is no adenopathy in the abdomen or pelvis. Reproductive: Uterus and ovaries are remarkable only for presence of an IUD which appears satisfactorily positioned. Other: No acute inflammatory changes are evident in the abdomen or pelvis. No ascites. Musculoskeletal: No significant musculoskeletal abnormality. IMPRESSION: Bilateral nephrolithiasis. No acute findings are evident in the abdomen or pelvis. Electronically Signed   By: Ellery Plunkaniel R Mitchell M.D.   On: 10/01/2015 23:46     I have personally reviewed and evaluated these images and lab results as part of my medical decision-making.   EKG Interpretation None      Medications  sodium chloride 0.9 % bolus 1,000 mL (0 mLs Intravenous Stopped 10/02/15 0119)    And  sodium chloride 0.9 % bolus 1,000 mL (0 mLs Intravenous Stopped 10/02/15 0119)    And  sodium chloride 0.9 % bolus 250 mL (not administered)  acetaminophen (TYLENOL) tablet 650 mg (650 mg Oral Given 10/01/15 1959)  ondansetron (ZOFRAN-ODT) disintegrating tablet 4 mg (4 mg Oral Given 10/01/15 1959)  ketorolac (TORADOL) 30 MG/ML injection 30 mg (30 mg Intravenous Given 10/01/15 2304)  iopamidol (ISOVUE-300) 61 % injection 100 mL (100 mLs Intravenous Contrast Given 10/01/15 2318)    CRITICAL CARE Performed by: Antony MaduraHUMES, Dearl Rudden   Total critical care time: 35 minutes  Critical care time was exclusive of separately billable procedures and treating other patients.  Critical care was necessary to treat or prevent imminent or life-threatening deterioration.  Critical care was time spent personally by me on the following activities: development of treatment plan with patient and/or surrogate as well as nursing, discussions with consultants, evaluation of patient's response to treatment, examination of patient, obtaining history from patient or surrogate, ordering and performing treatments and  interventions, ordering and review of laboratory studies, ordering and review of radiographic studies, pulse oximetry and re-evaluation of patient's condition.  MDM   Final diagnoses:  Vomiting and diarrhea  Sepsis, due to unspecified organism [facility](HCC)    50 year old female presents to the emergency department for evaluation of vomiting and diarrhea. Patient noted to be febrile to 102.19F on arrival with a tachycardia of 116. Patient with no leukocytosis. No electrolyte abnormalities. Liver and kidney function preserved. Urine pregnancy is negative. Urinalysis does not suggest infection.  CT abdomen pelvis ordered to evaluate for infectious cause of vomiting and diarrhea. Abdominal CT is unremarkable. Patient was hydrated with 2 L of IV fluids. She continues to have an elevated lactic acid level despite fluid hydration.  Vomiting and diarrhea is most  consistent with viral gastroenteritis. Antibiotics withheld for this reason. In light of inability to clear lactic acid level with fluids, will admit under observation for further monitoring and fluid hydration. Case discussed with Dr. Katrinka Blazing of [facility] who will evaluate the patient for admission.   Filed Vitals:   10/01/15 2212 10/01/15 2222 10/01/15 2345 10/02/15 0000  BP:  113/71    Pulse:  102 103 102  Temp: 100.8 F (38.2 C)     TempSrc: Oral     Resp:  Height:      Weight:      SpO2:  99% 96% 97%     Antony Madura, PA-C 10/02/15 0130  Raeford Razor, MD 10/04/15 1309

## 2015-10-01 NOTE — ED Notes (Signed)
Informed Dr. Juleen ChinaKohut of lactic acid of 2.25 @ 2105 by QA

## 2015-10-01 NOTE — ED Notes (Signed)
Pt informed that a urine specimen is needed. 

## 2015-10-02 ENCOUNTER — Observation Stay (HOSPITAL_COMMUNITY): Payer: BC Managed Care – PPO

## 2015-10-02 DIAGNOSIS — R197 Diarrhea, unspecified: Secondary | ICD-10-CM

## 2015-10-02 DIAGNOSIS — E860 Dehydration: Secondary | ICD-10-CM

## 2015-10-02 DIAGNOSIS — R6510 Systemic inflammatory response syndrome (SIRS) of non-infectious origin without acute organ dysfunction: Secondary | ICD-10-CM

## 2015-10-02 DIAGNOSIS — R112 Nausea with vomiting, unspecified: Secondary | ICD-10-CM

## 2015-10-02 DIAGNOSIS — E872 Acidosis: Secondary | ICD-10-CM | POA: Diagnosis not present

## 2015-10-02 LAB — BASIC METABOLIC PANEL
Anion gap: 7 (ref 5–15)
BUN: 11 mg/dL (ref 6–20)
CHLORIDE: 111 mmol/L (ref 101–111)
CO2: 22 mmol/L (ref 22–32)
Calcium: 7.9 mg/dL — ABNORMAL LOW (ref 8.9–10.3)
Creatinine, Ser: 0.77 mg/dL (ref 0.44–1.00)
GFR calc Af Amer: >60 mL/min (ref >60)
GFR calc non Af Amer: >60 mL/min (ref >60)
GLUCOSE: 131 mg/dL — AB (ref 65–99)
POTASSIUM: 3.8 mmol/L (ref 3.5–5.1)
Sodium: 140 mmol/L (ref 135–145)

## 2015-10-02 LAB — CBC
HEMATOCRIT: 36.2 % (ref 36.0–46.0)
HEMOGLOBIN: 12 g/dL (ref 12.0–15.0)
MCH: 30.5 pg (ref 26.0–34.0)
MCHC: 33.1 g/dL (ref 30.0–36.0)
MCV: 92.1 fL (ref 78.0–100.0)
Platelets: 211 10*3/uL (ref 150–400)
RBC: 3.93 MIL/uL (ref 3.87–5.11)
RDW: 12.5 % (ref 11.5–15.5)
WBC: 5.7 10*3/uL (ref 4.0–10.5)

## 2015-10-02 LAB — I-STAT CG4 LACTIC ACID, ED: Lactic Acid, Venous: 2.66 mmol/L (ref 0.5–2.0)

## 2015-10-02 LAB — LACTIC ACID, PLASMA
LACTIC ACID, VENOUS: 1.6 mmol/L (ref 0.5–2.0)
Lactic Acid, Venous: 0.9 mmol/L (ref 0.5–2.0)

## 2015-10-02 MED ORDER — ONDANSETRON HCL 4 MG PO TABS: 4.0000 mg | ORAL_TABLET | Freq: Four times a day (QID) | ORAL | Status: DC | PRN

## 2015-10-02 MED ORDER — ACETAMINOPHEN 325 MG PO TABS
650.0000 mg | ORAL_TABLET | Freq: Four times a day (QID) | ORAL | Status: DC | PRN
  Administered 2015-10-02: 650 mg via ORAL
  Filled 2015-10-02: qty 2

## 2015-10-02 MED ORDER — LORATADINE 10 MG PO TABS
10.0000 mg | ORAL_TABLET | Freq: Every day | ORAL | Status: DC | PRN
Start: 2015-10-02 — End: 2015-10-02

## 2015-10-02 MED ORDER — SODIUM CHLORIDE 0.9 % IV SOLN
INTRAVENOUS | Status: DC
  Administered 2015-10-02: 03:00:00 via INTRAVENOUS

## 2015-10-02 MED ORDER — LORATADINE 10 MG PO TABS: 10.0000 mg | ORAL_TABLET | Freq: Every day | ORAL | Status: AC | PRN

## 2015-10-02 MED ORDER — ONDANSETRON HCL 4 MG/2ML IJ SOLN: 4.0000 mg | Freq: Four times a day (QID) | INTRAMUSCULAR | Status: DC | PRN

## 2015-10-02 MED ORDER — ENOXAPARIN SODIUM 40 MG/0.4ML ~~LOC~~ SOLN
40.0000 mg | SUBCUTANEOUS | Status: DC
  Administered 2015-10-02: 40 mg via SUBCUTANEOUS
  Filled 2015-10-02: qty 0.4

## 2015-10-02 MED ORDER — ADULT MULTIVITAMIN W/MINERALS CH
1.0000 | ORAL_TABLET | Freq: Every day | ORAL | Status: DC
  Administered 2015-10-02: 1 via ORAL
  Filled 2015-10-02: qty 1

## 2015-10-02 MED ORDER — FLUTICASONE PROPIONATE 50 MCG/ACT NA SUSP: 2.0000 | Freq: Every day | NASAL | Status: AC | PRN

## 2015-10-02 MED ORDER — ALBUTEROL SULFATE (2.5 MG/3ML) 0.083% IN NEBU: 2.5000 mg | INHALATION_SOLUTION | RESPIRATORY_TRACT | Status: DC | PRN

## 2015-10-02 MED ORDER — ACETAMINOPHEN 650 MG RE SUPP: 650.0000 mg | Freq: Four times a day (QID) | RECTAL | Status: DC | PRN

## 2015-10-02 MED ORDER — LORATADINE 10 MG PO TABS
10.0000 mg | ORAL_TABLET | Freq: Every day | ORAL | Status: DC | PRN
  Filled 2015-10-02: qty 1

## 2015-10-02 MED ORDER — CIPROFLOXACIN IN D5W 400 MG/200ML IV SOLN
400.0000 mg | Freq: Two times a day (BID) | INTRAVENOUS | Status: DC
  Administered 2015-10-02: 400 mg via INTRAVENOUS
  Filled 2015-10-02 (×2): qty 200

## 2015-10-02 NOTE — H&P (Signed)
History and Physical    [name]:[id number] DOB: [birth year] DOA: 10/01/2015  Referring MD/NP/PA: Antony Madura, PA PCP: Dara Lords, MD  Outpatient Specialists: Dr. Colin Broach, gynecology Patient coming from: home  Chief Complaint: Nausea, vomiting, and diarrhea  HPI: Diana Walsh is a 50 y.o. female with medical history significant of seasonal allergies and nephrolithiasis; who presents with complaints of nausea, vomiting, and diarrhea. Symptoms initially started 5 days ago(5/4) in the evening with generalized abdominal pain after having grilled chicken and tacos along with her husband. She reports adding tomatoes to her meal, but her husband did not and had no symptoms. Thereafter she reports onset of nausea, vomiting, and diarrhea. The following day vomiting symptoms subsided but she still felt nauseous. With some diarrhea which taking some Pepto-Bismol some improvement in symptoms. Over the weekend patient felt somewhat better however on Monday she again noted eating tomatoes that she previously put on the grilled chicken tacos. She had acute onset of nausea, vomiting, and diarrhea. Associated symptoms included fever, feeling achy all over. Denies any recent antibiotics in the last few months. States that she usually does not get sick. Denies any chest pain, shortness breath, weight loss, rash, blood in stool or urine.  ED Course: Upon admission patient was seen to have elevated fever up to 102.34F with tachycardia heart rates up to 116, and all other final signs seem to be within normal limits. Initial lab work CBC and CMP was unremarkable except for a lactic acid which was elevated at 2.25 repeat elevated at 2.26. UA appeared to be contaminated. Chest x-ray showed a minimal linear opacity in the left lateral lung base that most likely atelectasis.  Review of Systems: As per HPI otherwise 10 point review of systems negative.    Past Medical History  Diagnosis Date    . Kidney stone   . LGSIL (low grade squamous intraepithelial dysplasia) 1990    normal paps since  . Allergy     Past Surgical History  Procedure Laterality Date  . Mirena      Inserted 05-16-10  . Breast surgery      Reduction  . Eye surgery      Bilateral  . Colposcopy  1990     reports that she has never smoked. She does not have any smokeless tobacco history on file. She reports that she drinks alcohol. She reports that she does not use illicit drugs.  Allergies  Allergen Reactions  . Penicillins Nausea And Vomiting    Has patient had a PCN reaction causing immediate rash, facial/tongue/throat swelling, SOB or lightheadedness with hypotension: Yes Has patient had a PCN reaction causing severe rash involving mucus membranes or skin necrosis: Yes Has patient had a PCN reaction that required hospitalization No Has patient had a PCN reaction occurring within the last 10 years: No If all of the above answers are "NO", then may proceed with Cephalosporin use.   . Codeine Palpitations    Family History  Problem Relation Age of Onset  . Heart disease Maternal Grandfather   . Heart disease Paternal Grandfather   . Hypertension Paternal Grandfather   . Hypertension Father   . Alzheimer's disease Maternal Grandmother     Prior to Admission medications   Medication Sig Start Date End Date Taking? Authorizing Provider  bismuth subsalicylate (PEPTO BISMOL) 262 MG chewable tablet Chew 524 mg by mouth daily as needed for indigestion.   Yes Historical Provider, MD  fluticasone (FLONASE) 50 MCG/ACT nasal spray Place 2  sprays into both nostrils at bedtime. Patient taking differently: Place 2 sprays into both nostrils daily as needed for allergies or rhinitis.  09/05/15  Yes Sherren Mocha, MD  levonorgestrel (MIRENA) 20 MCG/24HR IUD 1 each by Intrauterine route once. Reported on 09/05/2015   Yes Historical Provider, MD  loratadine (CLARITIN) 10 MG tablet Take 1 tablet (10 mg total) by  mouth daily. Patient taking differently: Take 10 mg by mouth daily as needed for allergies.  09/05/15  Yes Sherren Mocha, MD  Multiple Vitamin (MULTIVITAMIN) tablet Take 1 tablet by mouth daily. Reported on 09/05/2015   Yes Historical Provider, MD  pseudoephedrine (SUDAFED) 120 MG 12 hr tablet Take 120 mg by mouth 2 (two) times daily as needed for congestion.   Yes Historical Provider, MD  chlorpheniramine-HYDROcodone (TUSSIONEX PENNKINETIC ER) 10-8 MG/5ML SUER Take 5 mLs by mouth every 12 (twelve) hours as needed. Patient not taking: Reported on 10/01/2015 09/05/15   Sherren Mocha, MD    Physical Exam: Filed Vitals:   10/01/15 2212 10/01/15 2222 10/01/15 2345 10/02/15 0000  BP:  113/71    Pulse:  102 103 102  Temp: 100.8 F (38.2 C)     TempSrc: Oral     Resp:  20 19 18   Height:      Weight:      SpO2:  99% 96% 97%      Constitutional: NAD, appears sick, but not toxic. Filed Vitals:   10/01/15 2212 10/01/15 2222 10/01/15 2345 10/02/15 0000  BP:  113/71    Pulse:  102 103 102  Temp: 100.8 F (38.2 C)     TempSrc: Oral     Resp:  20 19 18   Height:      Weight:      SpO2:  99% 96% 97%   Eyes: PERRL, lids and conjunctivae normal ENMT: Mucous membranes are moist. Posterior pharynx clear of any exudate or lesions.Normal dentition.  Neck: normal, supple, no masses, no thyromegaly Respiratory: clear to auscultation bilaterally, no wheezing, no crackles. Normal respiratory effort. No accessory muscle use.  Cardiovascular: Regular rate and rhythm, no murmurs / rubs / gallops. No extremity edema. 2+ pedal pulses. No carotid bruits.  Abdomen: no tenderness, no masses palpated. No hepatosplenomegaly. Bowel sounds positive.  Musculoskeletal: no clubbing / cyanosis. No joint deformity upper and lower extremities. Good ROM, no contractures. Normal muscle tone.  Skin: no rashes, lesions, ulcers. No induration Neurologic: CN 2-12 grossly intact. Sensation intact, DTR normal. Strength 5/5 in all 4.   Psychiatric: Normal judgment and insight. Alert and oriented x 3. Normal mood.    Labs on Admission: I have personally reviewed following labs and imaging studies  CBC:  Recent Labs Lab 10/01/15 2044  WBC 9.0  NEUTROABS 8.6*  HGB 14.1  HCT 42.4  MCV 91.6  PLT 217   Basic Metabolic Panel:  Recent Labs Lab 10/01/15 2044  NA 138  K 4.0  CL 105  CO2 22  GLUCOSE 122*  BUN 15  CREATININE 0.89  CALCIUM 9.0   GFR: Estimated Creatinine Clearance: 70.2 mL/min (by C-G formula based on Cr of 0.89). Liver Function Tests:  Recent Labs Lab 10/01/15 2044  AST 31  ALT 39  ALKPHOS 70  BILITOT 0.8  PROT 7.1  ALBUMIN 4.2   No results for input(s): LIPASE, AMYLASE in the last 168 hours. No results for input(s): AMMONIA in the last 168 hours. Coagulation Profile: No results for input(s): INR, PROTIME in the last 168 hours.  Cardiac Enzymes: No results for input(s): CKTOTAL, CKMB, CKMBINDEX, TROPONINI in the last 168 hours. BNP (last 3 results) No results for input(s): PROBNP in the last 8760 hours. HbA1C: No results for input(s): HGBA1C in the last 72 hours. CBG: No results for input(s): GLUCAP in the last 168 hours. Lipid Profile: No results for input(s): CHOL, HDL, LDLCALC, TRIG, CHOLHDL, LDLDIRECT in the last 72 hours. Thyroid Function Tests: No results for input(s): TSH, T4TOTAL, FREET4, T3FREE, THYROIDAB in the last 72 hours. Anemia Panel: No results for input(s): VITAMINB12, FOLATE, FERRITIN, TIBC, IRON, RETICCTPCT in the last 72 hours. Urine analysis:    Component Value Date/Time   COLORURINE YELLOW 10/01/2015 2316   APPEARANCEUR CLEAR 10/01/2015 2316   LABSPEC 1.010 10/01/2015 2316   PHURINE 6.0 10/01/2015 2316   GLUCOSEU NEGATIVE 10/01/2015 2316   HGBUR SMALL* 10/01/2015 2316   BILIRUBINUR NEGATIVE 10/01/2015 2316   KETONESUR NEGATIVE 10/01/2015 2316   PROTEINUR NEGATIVE 10/01/2015 2316   UROBILINOGEN 0.2 10/06/2013 1539   NITRITE NEGATIVE 10/01/2015  2316   LEUKOCYTESUR TRACE* 10/01/2015 2316   Sepsis Labs: (procalcitonin:4,lacticidven:4) )No results found for this or any previous visit (from the past 240 hour(s)).   Radiological Exams on Admission: Ct Abdomen Pelvis W Contrast  10/01/2015  CLINICAL DATA:  Nausea vomiting and fever several days ago, recurrent today. EXAM: CT ABDOMEN AND PELVIS WITH CONTRAST TECHNIQUE: Multidetector CT imaging of the abdomen and pelvis was performed using the standard protocol following bolus administration of intravenous contrast. CONTRAST:  ISOVUE-300 IOPAMIDOL (ISOVUE-300) INJECTION 61% COMPARISON:  None. FINDINGS: Lower chest:  No significant abnormality Hepatobiliary: There are normal appearances of the liver, gallbladder and bile ducts. Pancreas: Normal Spleen: Norm Adrenals/Urinary Tract: Both adrenals are normal. No significant renal parenchymal lesions. There are collecting system calculi bilaterally, measuring up to 10 mm in the right lower pole and up to 7 mm in the left lower pole. No ureteral calculi. Urinary bladder is nearly completely empty, without obvious abnormality. Stomach/Bowel: There are normal appearances of the stomach, small bowel and colon. The appendix is normal. Vascular/Lymphatic: The abdominal aorta is normal in caliber. There is no atherosclerotic calcification. There is no adenopathy in the abdomen or pelvis. Reproductive: Uterus and ovaries are remarkable only for presence of an IUD which appears satisfactorily positioned. Other: No acute inflammatory changes are evident in the abdomen or pelvis. No ascites. Musculoskeletal: No significant musculoskeletal abnormality. IMPRESSION: Bilateral nephrolithiasis. No acute findings are evident in the abdomen or pelvis. Electronically Signed   By: Ellery Plunk M.D.   On: 10/01/2015 23:46      Assessment/Plan Nausea, vomiting, and diarrhea: Given patient's recurrent ingestion of tomatoes prior to onset of symptoms.  Suspect possible acute gastroenteritis/food poisoning. Causative agent could be salmonella. - Admit to MedSurg bed  - Check Gastrointestinal panel  - Normal saline IV fluids 100 ml/hr - ciprofloxacin IV - Zofran prn nausea - Repeat CBC and BMP in a.m. - Advance diet as tolerated to a regular   SIRS: Acute. fever up to 102.82F with heart rates up to 122 on arrival. Patient found to have elevated lactic acid level of 2.6. No clear source found at this time. - Tylenol prn fever  - f/u urine culture  Lactic acidosis: Acute elevated up to 2.6 on admission.  - Follow-up repeat lactic acid level, if continuing to trend up towards will also obtain blood cultures  Dehydration secondary to nausea vomiting and diarrhea - Continue IV fluids as seen above   DVT prophylaxis:  lovenox Code Status: Full Family Communication: Discussed plan with the patient's mother was present in the Disposition Plan: Likely discharge in the a.m.  Consults called: None Admission status: observation   Clydie Braunondell A Smith MD [facility] Pager [phone]  If 7PM-7AM, please contact night-coverage [email or web address] Password [email or web address]  10/02/2015, 1:19 AM

## 2015-10-02 NOTE — Discharge Summary (Signed)
Physician Discharge Summary  Diana EnglishMichelle Walsh ZOX:[id number] DOB: 1966-05-23 DOA: 10/01/2015  PCP: Diana LordsFONTAINE,Diana P, MD  Admit date: 10/01/2015 Discharge date: 10/02/2015  Time spent: 45 minutes  Recommendations for Outpatient Follow-up:  1. PCP Dr.Fontaine in 2 weeks   Discharge Diagnoses:  Principal Problem:   Nausea vomiting and diarrhea   Dehydration   SIRS (systemic inflammatory response syndrome) (HCC)   Lactic acidosis   Nephrolithiasis   Allergies  Discharge Condition: stable  Diet recommendation: regular  Filed Weights   10/01/15 1948  Weight: 67.132 kg (148 lb)    History of present illness:  Chief Complaint: Nausea, vomiting, and diarrhea HPI: Diana Walsh is a 50 y.o. female with medical history significant of seasonal allergies and nephrolithiasis; who presents with complaints of nausea, vomiting, and diarrhea. Symptoms initially started 5 days ago(5/4) in the evening with generalized abdominal pain after having grilled chicken and tacos along with her husband. She reported adding tomatoes to her meal, but her husband did not and had no symptoms. Thereafter she reported onset of nausea, vomiting, and diarrhea. The following day vomiting symptoms subsided but she still felt nauseous. With some diarrhea which taking some Pepto-Bismol some improvement in symptoms. Over the weekend patient felt somewhat better however on Monday she again noted eating tomatoes that she previously put on the grilled chicken tacos. She had acute onset of nausea, vomiting, and diarrhea. Associated symptoms included fever, feeling achy all over and came to the ER.  Hospital Course:  Nausea, vomiting, and diarrhea:  -felt to have possible acute viral gastroenteritis/food poisoning -CT Abd unremarkable except for known Nephrolithiasis  -the morning after admission, she had improved, had no further fever, vomiting or diarrhea. -was able to tolerate regular diet for breakfast and lunch and  hence discharged home in a stable condition -lactate was elevated on admission, this was due to dehydration, corrected with hydration  Discharge Exam: Filed Vitals:   10/02/15 0530 10/02/15 1422  BP: 107/80 93/58  Pulse: 94 83  Temp: 99.5 F (37.5 C) 98.3 F (36.8 C)  Resp: 16 18    General:AAOx3 Cardiovascular: S1S2/RRR Respiratory: CTAB  Discharge Instructions   Discharge Instructions    Discharge instructions    Complete by:  As directed   Soft bland diet, advance as tolerated     Increase activity slowly    Complete by:  As directed           Discharge Medication List as of 10/02/2015  2:23 PM    CONTINUE these medications which have CHANGED   Details  fluticasone (FLONASE) 50 MCG/ACT nasal spray Place 2 sprays into both nostrils daily as needed for allergies or rhinitis., Starting 10/02/2015, Until Discontinued, No Print    loratadine (CLARITIN) 10 MG tablet Take 1 tablet (10 mg total) by mouth daily as needed for allergies., Starting 10/02/2015, Until Discontinued, No Print      CONTINUE these medications which have NOT CHANGED   Details  bismuth subsalicylate (PEPTO BISMOL) 262 MG chewable tablet Chew 524 mg by mouth daily as needed for indigestion., Until Discontinued, Historical Med    levonorgestrel (MIRENA) 20 MCG/24HR IUD 1 each by Intrauterine route once. Reported on 09/05/2015, Historical Med    Multiple Vitamin (MULTIVITAMIN) tablet Take 1 tablet by mouth daily. Reported on 09/05/2015, Until Discontinued, Historical Med      STOP taking these medications     pseudoephedrine (SUDAFED) 120 MG 12 hr tablet      chlorpheniramine-HYDROcodone (TUSSIONEX PENNKINETIC ER) 10-8 MG/5ML SUER  Allergies  Allergen Reactions  . Penicillins Nausea And Vomiting    Has patient had a PCN reaction causing immediate rash, facial/tongue/throat swelling, SOB or lightheadedness with hypotension:  Has patient had a PCN reaction causing severe rash involving mucus  membranes or skin necrosis:  Has patient had a PCN reaction that required hospitalization No Has patient had a PCN reaction occurring within the last 10 years: No If all of the above answers are "NO", then may proceed with Cephalosporin use.   . Codeine Palpitations   Follow-up Information    Follow up with Diana Lords, MD. Schedule an appointment as soon as possible for a visit in 1 week.   Specialties:  Gynecology, Radiology   Contact information:   [address] [place] [address] [phone]        The results of significant diagnostics from this hospitalization (including imaging, microbiology, ancillary and laboratory) are listed below for reference.    Significant Diagnostic Studies: Dg Chest 2 View  10/02/2015  CLINICAL DATA:  Fever chills and vomiting for over 1 week. EXAM: CHEST  2 VIEW COMPARISON:  07/08/2004 FINDINGS: The lungs are clear except for minimal linear atelectatic appearing opacity in the lateral left base. The pulmonary vasculature is normal. Heart size is normal. Hilar and mediastinal contours are unremarkable. There is no pleural effusion. IMPRESSION: Minimal linear opacity in the lateral left base, likely atelectatic. No other significant abnormality. Electronically Signed   By: Ellery Plunk M.D.   On: 10/02/2015 02:15   Ct Abdomen Pelvis W Contrast  10/01/2015  CLINICAL DATA:  Nausea vomiting and fever several days ago, recurrent today. EXAM: CT ABDOMEN AND PELVIS WITH CONTRAST TECHNIQUE: Multidetector CT imaging of the abdomen and pelvis was performed using the standard protocol following bolus administration of intravenous contrast. CONTRAST:  ISOVUE-300 IOPAMIDOL (ISOVUE-300) INJECTION 61% COMPARISON:  None. FINDINGS: Lower chest:  No significant abnormality Hepatobiliary: There are normal appearances of the liver, gallbladder and bile ducts. Pancreas: Normal Spleen: Norm Adrenals/Urinary Tract: Both adrenals are normal. No  significant renal parenchymal lesions. There are collecting system calculi bilaterally, measuring up to 10 mm in the right lower pole and up to 7 mm in the left lower pole. No ureteral calculi. Urinary bladder is nearly completely empty, without obvious abnormality. Stomach/Bowel: There are normal appearances of the stomach, small bowel and colon. The appendix is normal. Vascular/Lymphatic: The abdominal aorta is normal in caliber. There is no atherosclerotic calcification. There is no adenopathy in the abdomen or pelvis. Reproductive: Uterus and ovaries are remarkable only for presence of an IUD which appears satisfactorily positioned. Other: No acute inflammatory changes are evident in the abdomen or pelvis. No ascites. Musculoskeletal: No significant musculoskeletal abnormality. IMPRESSION: Bilateral nephrolithiasis. No acute findings are evident in the abdomen or pelvis. Electronically Signed   By: Ellery Plunk M.D.   On: 10/01/2015 23:46    Microbiology: No results found for this or any previous visit (from the past 240 hour(s)).   Labs: Basic Metabolic Panel:  Recent Labs Lab 10/01/15 2044 10/02/15 0316  NA 138 140  K 4.0 3.8  CL 105 111  CO2 22 22  GLUCOSE 122* 131*  BUN 15 11  CREATININE 0.89 0.77  CALCIUM 9.0 7.9*   Liver Function Tests:  Recent Labs Lab 10/01/15 2044  AST 31  ALT 39  ALKPHOS 70  BILITOT 0.8  PROT 7.1  ALBUMIN 4.2   No results for input(s): LIPASE, AMYLASE in the last 168  hours. No results for input(s): AMMONIA in the last 168 hours. CBC:  Recent Labs Lab 10/01/15 2044 10/02/15 0316  WBC 9.0 5.7  NEUTROABS 8.6*  --   HGB 14.1 12.0  HCT 42.4 36.2  MCV 91.6 92.1  PLT 217 211   Cardiac Enzymes: No results for input(s): CKTOTAL, CKMB, CKMBINDEX, TROPONINI in the last 168 hours. BNP: BNP (last 3 results) No results for input(s): BNP in the last 8760 hours.  ProBNP (last 3 results) No results for input(s): PROBNP in the last 8760  hours.  CBG: No results for input(s): GLUCAP in the last 168 hours.     SignedZannie Cove MD.  [facility] 10/02/2015, 5:17 PM

## 2015-10-02 NOTE — ED Notes (Signed)
In xray

## 2015-10-02 NOTE — Progress Notes (Signed)
Patient given discharge instructions, and verbalized an understanding of all discharge instructions.  Patient agrees with discharge plan, and is being discharged in stable medical condition.  Patient assisted to front door by nurse tech. 

## 2015-10-03 LAB — URINE CULTURE

## 2015-10-07 LAB — BLOOD CULTURE ID PANEL (REFLEXED)
ACINETOBACTER BAUMANNII: NOT DETECTED
CANDIDA ALBICANS: NOT DETECTED
Candida glabrata: NOT DETECTED
Candida krusei: NOT DETECTED
Candida parapsilosis: NOT DETECTED
Candida tropicalis: NOT DETECTED
Carbapenem resistance: NOT DETECTED
ENTEROBACTER CLOACAE COMPLEX: NOT DETECTED
ENTEROBACTERIACEAE SPECIES: NOT DETECTED
Enterococcus species: NOT DETECTED
Escherichia coli: NOT DETECTED
HAEMOPHILUS INFLUENZAE: NOT DETECTED
Klebsiella oxytoca: NOT DETECTED
Klebsiella pneumoniae: NOT DETECTED
Listeria monocytogenes: NOT DETECTED
METHICILLIN RESISTANCE: NOT DETECTED
Neisseria meningitidis: NOT DETECTED
PSEUDOMONAS AERUGINOSA: NOT DETECTED
Proteus species: NOT DETECTED
STAPHYLOCOCCUS AUREUS BCID: NOT DETECTED
STAPHYLOCOCCUS SPECIES: NOT DETECTED
STREPTOCOCCUS AGALACTIAE: NOT DETECTED
STREPTOCOCCUS PNEUMONIAE: NOT DETECTED
STREPTOCOCCUS PYOGENES: NOT DETECTED
Serratia marcescens: NOT DETECTED
Streptococcus species: NOT DETECTED
VANCOMYCIN RESISTANCE: NOT DETECTED

## 2015-10-07 LAB — CULTURE, BLOOD (ROUTINE X 2): Culture: NO GROWTH

## 2015-10-08 LAB — CULTURE, BLOOD (ROUTINE X 2)

## 2016-09-08 ENCOUNTER — Ambulatory Visit (INDEPENDENT_AMBULATORY_CARE_PROVIDER_SITE_OTHER): Payer: BC Managed Care – PPO | Admitting: Urgent Care

## 2016-09-08 VITALS — BP 120/78 | HR 82 | Temp 98.1°F | Ht 62.0 in | Wt 164.6 lb

## 2016-09-08 DIAGNOSIS — Z9109 Other allergy status, other than to drugs and biological substances: Secondary | ICD-10-CM | POA: Diagnosis not present

## 2016-09-08 DIAGNOSIS — H6592 Unspecified nonsuppurative otitis media, left ear: Secondary | ICD-10-CM

## 2016-09-08 MED ORDER — AZITHROMYCIN 250 MG PO TABS: ORAL_TABLET | ORAL | 0 refills | Status: DC

## 2016-09-08 NOTE — Progress Notes (Signed)
  MRN: [id number] DOB: 10-11-1965  Subjective:   Diana Walsh is a 51 y.o. female presenting for chief complaint of Ear Fullness (X 1 week) and Allergies  Reports 1 week history of worsening left ear fullness, discomfort, ear popping. Has a history of difficult to control allergies. Denies fever, sinus pain, ear pain, ear drainage, tinnitus, dizziness, cough chest pain, shob, wheezing. Denies smoking cigarettes.   Diana Walsh has a current medication list which includes the following prescription(s): bismuth subsalicylate, fluticasone, levonorgestrel, loratadine, and multivitamin. Also is allergic to penicillins and codeine. Diana Walsh  has a past medical history of Allergy; Kidney stone; and LGSIL (low grade squamous intraepithelial dysplasia) (1990). Also  has a past surgical history that includes MIRENA; Breast surgery; Eye surgery; and Colposcopy (1990).  Objective:   Vitals: BP 120/78 (BP Location: Right Arm, Patient Position: Sitting, Cuff Size: Large)   Pulse 82   Temp 98.1 F (36.7 C) (Oral)   Ht  (1.575 m)   Wt 164 lb 9.6 oz (74.7 kg)   SpO2 97%   BMI 30.11 kg/m   Physical Exam  Constitutional: She is oriented to person, place, and time. She appears well-developed and well-nourished.  HENT:  Left TM with effusion but no erythema, tragus tenderness.  Nasal turbinates pink and moist, nasal passages patent. No sinus tenderness. Throat with moderate post-nasal drainage, mucous membranes moist, dentition in good repair.  Eyes: Right eye exhibits no discharge. Left eye exhibits no discharge.  Neck: Normal range of motion. Neck supple.  Cardiovascular: Normal rate, regular rhythm and intact distal pulses.  Exam reveals no gallop and no friction rub.   No murmur heard. Pulmonary/Chest: No respiratory distress. She has no wheezes. She has no rales.  Lymphadenopathy:    She has cervical adenopathy (left, upper).  Neurological: She is alert and oriented to person, place, and  time.  Skin: Skin is warm and dry.  Psychiatric: She has a normal mood and affect.   Assessment and Plan :   1. Fluid level behind tympanic membrane of left ear - Patient has adverse reactions with amoxicillin, will use azithromycin as it is an acceptable alternative. Recheck in 1 week if no improvement. - azithromycin (ZITHROMAX) 250 MG tablet; Start with 2 tablets today, then 1 daily thereafter.  Dispense: 6 tablet; Refill: 0  2. Environmental allergies - Restart allergy treatment including Flonase, Claritin-D.  Wallis Bamberg, PA-C Primary Care at [facility] [phone] 09/08/2016  10:37 AM

## 2016-09-08 NOTE — Patient Instructions (Addendum)
Eustachian Tube Dysfunction The eustachian tube connects the middle ear to the back of the nose. It regulates air pressure in the middle ear by allowing air to move between the ear and nose. It also helps to drain fluid from the middle ear space. When the eustachian tube does not function properly, air pressure, fluid, or both can build up in the middle ear. Eustachian tube dysfunction can affect one or both ears. What are the causes? This condition happens when the eustachian tube becomes blocked or cannot open normally. This may result from:  Ear infections.  Colds and other upper respiratory infections.  Allergies.  Irritation, such as from cigarette smoke or acid from the stomach coming up into the esophagus (gastroesophageal reflux).  Sudden changes in air pressure, such as from descending in an airplane.  Abnormal growths in the nose or throat, such as nasal polyps, tumors, or enlarged tissue at the back of the throat (adenoids). What increases the risk? This condition may be more likely to develop in people who smoke and people who are overweight. Eustachian tube dysfunction may also be more likely to develop in children, especially children who have:  Certain birth defects of the mouth, such as cleft palate.  Large tonsils and adenoids. What are the signs or symptoms? Symptoms of this condition may include:  A feeling of fullness in the ear.  Ear pain.  Clicking or popping noises in the ear.  Ringing in the ear.  Hearing loss.  Loss of balance. Symptoms may get worse when the air pressure around you changes, such as when you travel to an area of high elevation or fly on an airplane. How is this diagnosed? This condition may be diagnosed based on:  Your symptoms.  A physical exam of your ear, nose, and throat.  Tests, such as those that measure:  The movement of your eardrum (tympanogram).  Your hearing (audiometry). How is this treated? Treatment depends on  the cause and severity of your condition. If your symptoms are mild, you may be able to relieve your symptoms by moving air into ("popping") your ears. If you have symptoms of fluid in your ears, treatment may include:  Decongestants.  Antihistamines.  Nasal sprays or ear drops that contain medicines that reduce swelling (steroids). In some cases, you may need to have a procedure to drain the fluid in your eardrum (myringotomy). In this procedure, a small tube is placed in the eardrum to:  Drain the fluid.  Restore the air in the middle ear space. Follow these instructions at home:  Take over-the-counter and prescription medicines only as told by your health care provider.  Use techniques to help pop your ears as recommended by your health care provider. These may include:  Chewing gum.  Yawning.  Frequent, forceful swallowing.  Closing your mouth, holding your nose closed, and gently blowing as if you are trying to blow air out of your nose.  Do not do any of the following until your health care provider approves:  Travel to high altitudes.  Fly in airplanes.  Work in a pressurized cabin or room.  Scuba dive.  Keep your ears dry. Dry your ears completely after showering or bathing.  Do not smoke.  Keep all follow-up visits as told by your health care provider. This is important. Contact a health care provider if:  Your symptoms do not go away after treatment.  Your symptoms come back after treatment.  You are unable to pop your ears.    You have:  A fever.  Pain in your ear.  Pain in your head or neck.  Fluid draining from your ear.  Your hearing suddenly changes.  You become very dizzy.  You lose your balance. This information is not intended to replace advice given to you by your health care provider. Make sure you discuss any questions you have with your health care provider. Document Released: 06/08/2015 Document Revised: 10/18/2015 Document  Reviewed: 05/31/2014 Elsevier Interactive Patient Education  2017 [employer]     IF you received an x-ray today, you will receive an invoice from [facility]. Please contact [facility] at [phone] with questions or concerns regarding your invoice.   IF you received labwork today, you will receive an invoice from [facility]. Please contact [facility] at [phone] with questions or concerns regarding your invoice.   Our billing staff will not be able to assist you with questions regarding bills from these companies.  You will be contacted with the lab results as soon as they are available. The fastest way to get your results is to activate your My Chart account. Instructions are located on the last page of this paperwork. If you have not heard from us regarding the results in 2 weeks, please contact this office.      

## 2016-09-23 IMAGING — CT CT ABD-PELV W/ CM
2 of 5 series · 16 of 46 positions shown, 18 images · IV contrast (ISOVUE)
Comparison: None.

CLINICAL DATA: Nausea vomiting and fever several days ago,
recurrent today.

EXAM:
CT ABDOMEN AND PELVIS WITH CONTRAST
TECHNIQUE: Multidetector CT imaging of the abdomen and pelvis was performed
using the standard protocol following bolus administration of
intravenous contrast.
CONTRAST:  100mL 97M1ND-SRR IOPAMIDOL (97M1ND-SRR) INJECTION 61%

[Series 2: abd/pel with · axial · 0.74mm/px · z∈[-454,-40]mm · 13 of 97 slices shown, 15 images]
[im 7/97  soft-tissue]
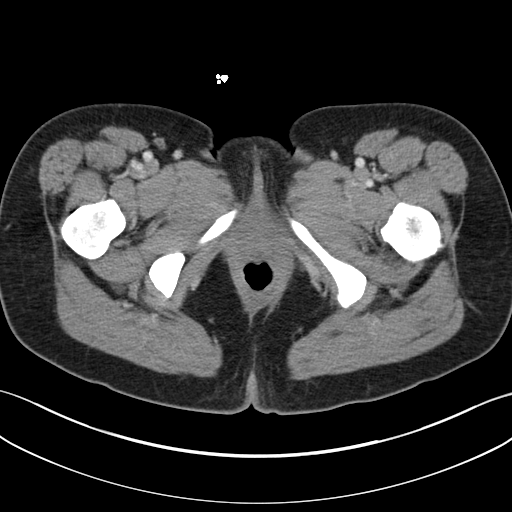
[im 7/97  bone]
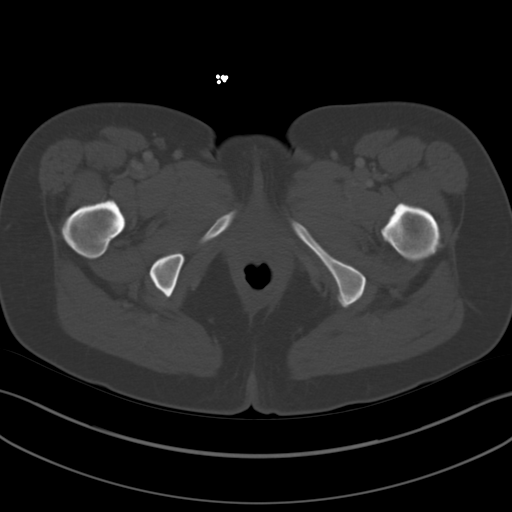
[im 13/97  soft-tissue]
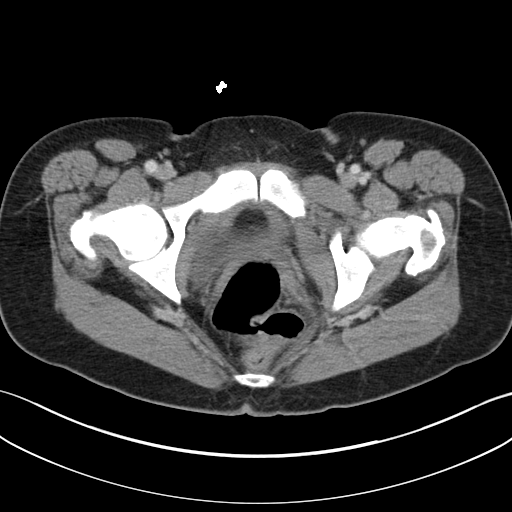
[im 20/97  soft-tissue]
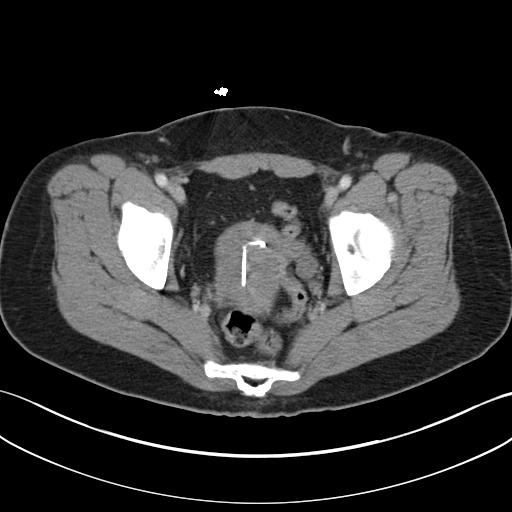
[im 26/97  soft-tissue]
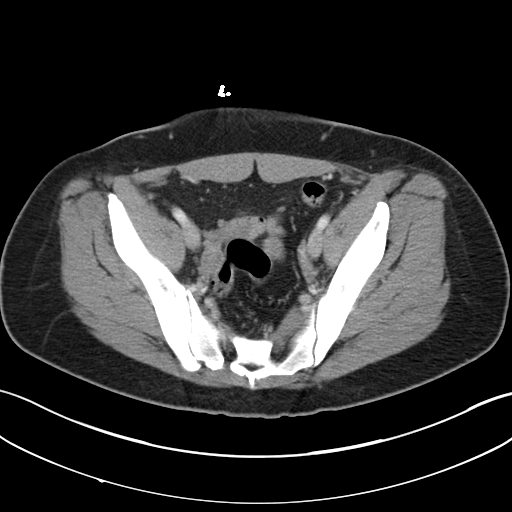
[im 33/97  soft-tissue]
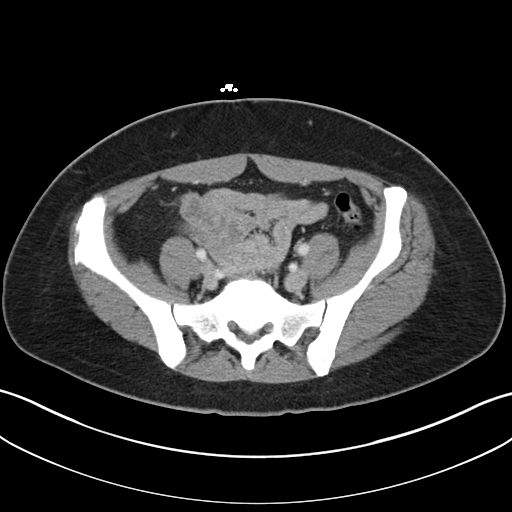
[im 39/97  soft-tissue]
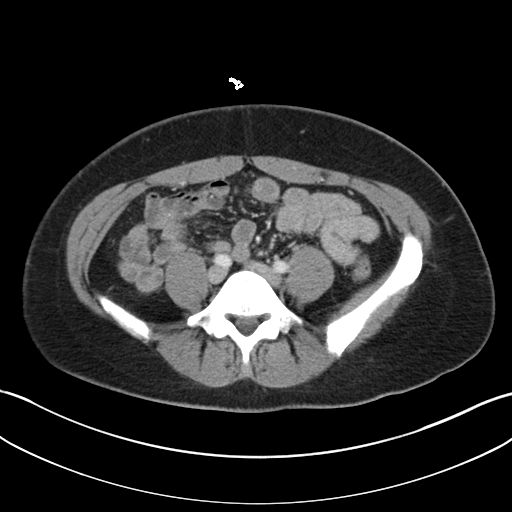
[im 52/97  soft-tissue]
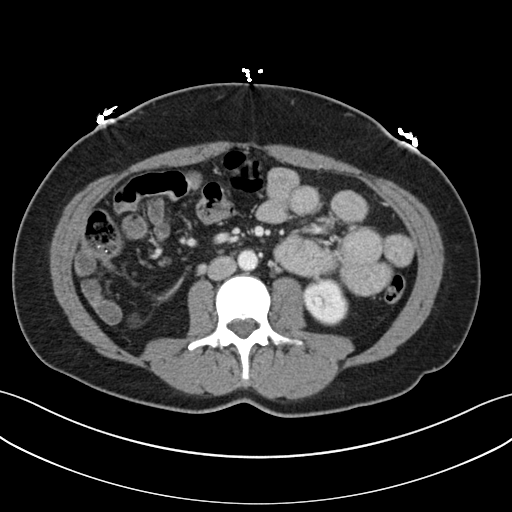
[im 58/97  soft-tissue]
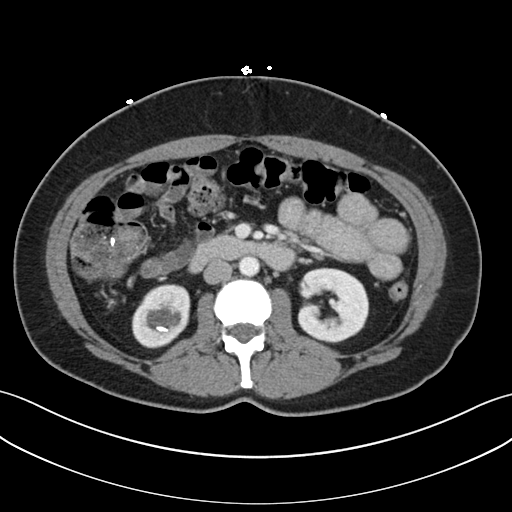
[im 65/97  soft-tissue]
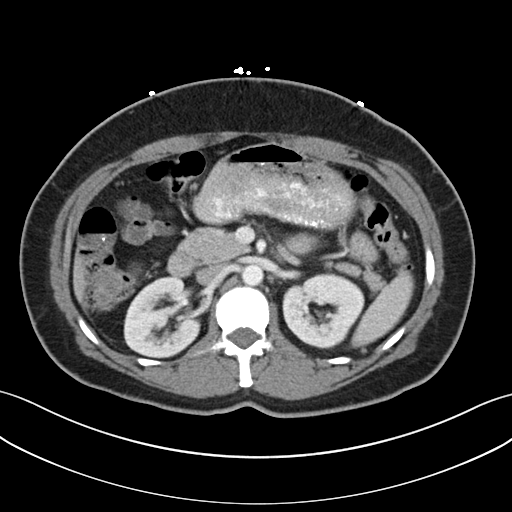
[im 65/97  bone]
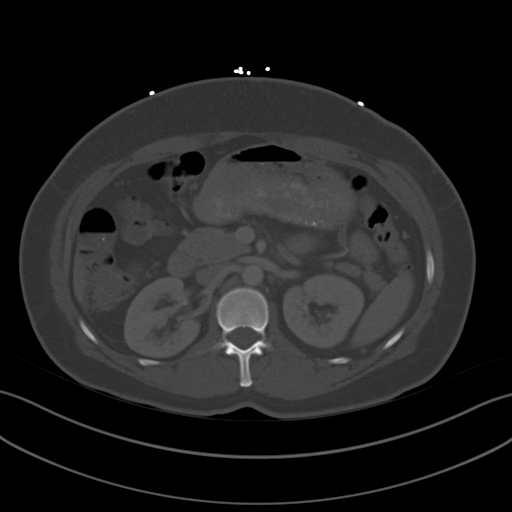
[im 71/97  soft-tissue]
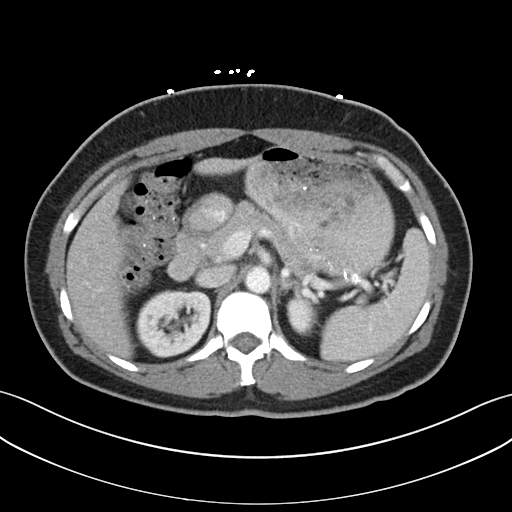
[im 77/97  soft-tissue]
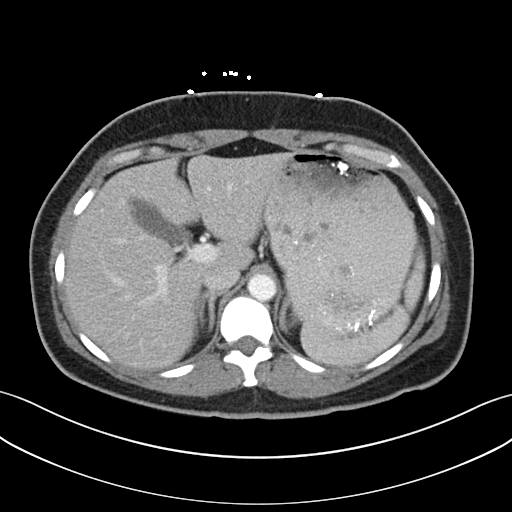
[im 84/97  soft-tissue]
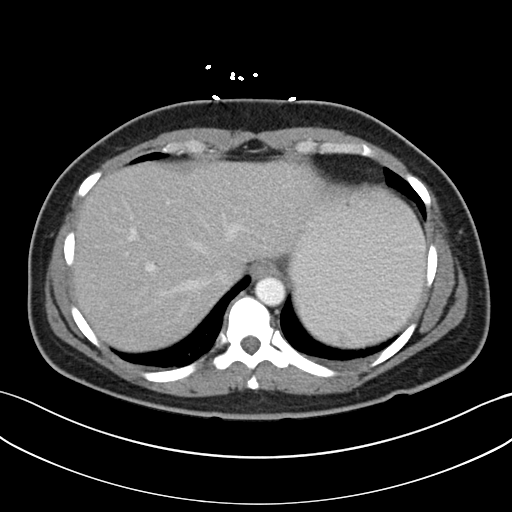
[im 90/97  soft-tissue]
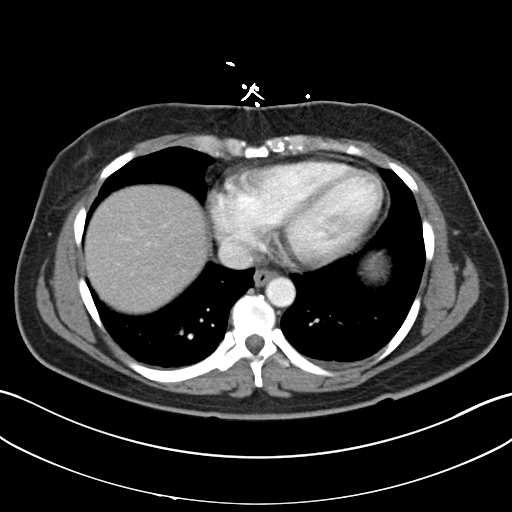

[Series 6: coronal a/|p · coronal · 0.82mm/px · 3 of 132 slices shown]
[im 44/132  soft-tissue]
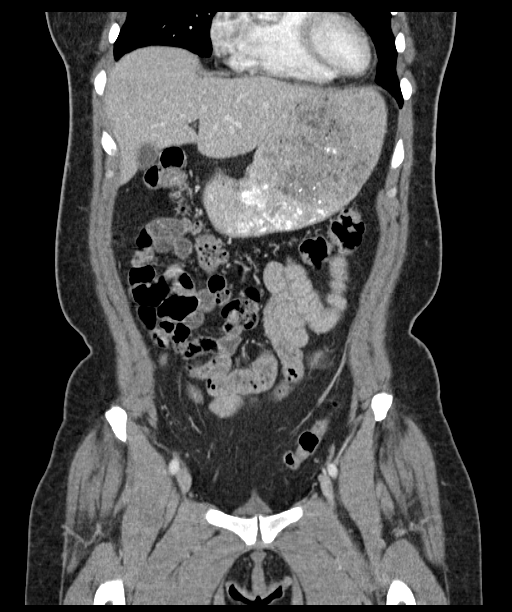
[im 59/132  soft-tissue]
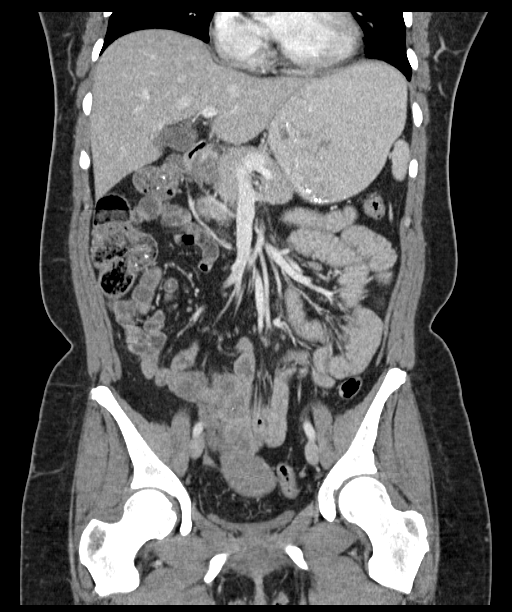
[im 73/132  soft-tissue]
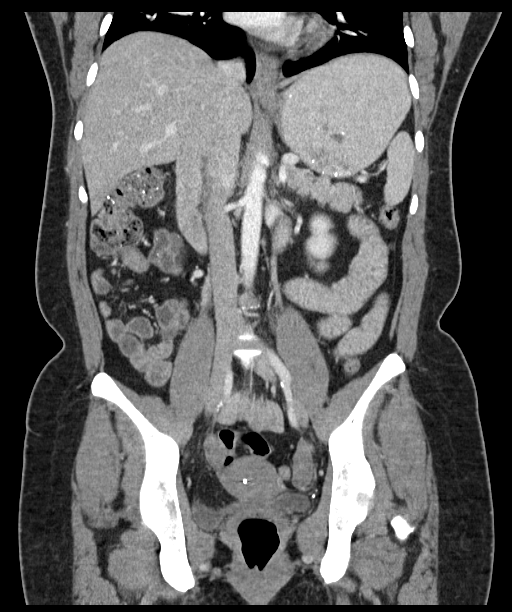

[16 of 46 positions shown; findings below may reference images not displayed]

FINDINGS: Lower chest:  No significant abnormality

Hepatobiliary: There are normal appearances of the liver,
gallbladder and bile ducts.

Pancreas: Normal

Spleen: Norm

Adrenals/Urinary Tract: Both adrenals are normal. No significant
renal parenchymal lesions. There are collecting system calculi
bilaterally, measuring up to 10 mm in the right lower pole and up to
7 mm in the left lower pole. No ureteral calculi. Urinary bladder is
nearly completely empty, without obvious abnormality.

Stomach/Bowel: There are normal appearances of the stomach, small
bowel and colon. The appendix is normal.

Vascular/Lymphatic: The abdominal aorta is normal in caliber. There
is no atherosclerotic calcification. There is no adenopathy in the
abdomen or pelvis.

Reproductive: Uterus and ovaries are remarkable only for presence of
an IUD which appears satisfactorily positioned.

Other: No acute inflammatory changes are evident in the abdomen or
pelvis. No ascites.

Musculoskeletal: No significant musculoskeletal abnormality.
IMPRESSION: Bilateral nephrolithiasis. No acute findings are evident in the
abdomen or pelvis.

## 2016-12-29 ENCOUNTER — Other Ambulatory Visit: Payer: Self-pay | Admitting: Gynecology

## 2016-12-29 DIAGNOSIS — Z1231 Encounter for screening mammogram for malignant neoplasm of breast: Secondary | ICD-10-CM

## 2017-01-19 ENCOUNTER — Ambulatory Visit: Payer: BC Managed Care – PPO

## 2017-02-21 ENCOUNTER — Encounter: Payer: Self-pay | Admitting: Family Medicine

## 2017-02-21 ENCOUNTER — Ambulatory Visit (INDEPENDENT_AMBULATORY_CARE_PROVIDER_SITE_OTHER): Payer: BC Managed Care – PPO | Admitting: Family Medicine

## 2017-02-21 VITALS — BP 122/84 | HR 71 | Temp 98.2°F | Resp 16 | Ht 63.0 in | Wt 159.2 lb

## 2017-02-21 DIAGNOSIS — H6992 Unspecified Eustachian tube disorder, left ear: Secondary | ICD-10-CM

## 2017-02-21 DIAGNOSIS — H6122 Impacted cerumen, left ear: Secondary | ICD-10-CM

## 2017-02-21 NOTE — Progress Notes (Signed)
9/29/20181:21 PM  Diana Walsh Jul 30, 1965, 51 y.o. female [id number]  Chief Complaint  Patient presents with  . Ear Fullness    Left ear. on & off x 3 months, has worsened recently. Was seen 09/08/16 for similiar issue  . Sore Throat    Pateint describes as "Scratchy" x 2-3 days    HPI:   Patient is a 51 y.o. female who presents today for months of intermittent left ear fullness, soreness, with acute worsening over past days associated with scratchy throat. She denies fever or chills, nasal congestion, ear drainage, loss of hearing. This tends to  Happen every spring and fall. No cough or SOB.   Depression screen [facility] 2/9 02/21/2017 09/08/2016 09/05/2015  Decreased Interest 0 0 0  Down, Depressed, Hopeless 0 0 0  PHQ - 2 Score 0 0 0    Allergies: Penicillin and codeine  Current Outpatient Prescriptions on File Prior to Visit  Medication Sig Dispense Refill  . fluticasone (FLONASE) 50 MCG/ACT nasal spray Place 2 sprays into both nostrils daily as needed for allergies or rhinitis.    Marland Kitchen levonorgestrel (MIRENA) 20 MCG/24HR IUD 1 each by Intrauterine route once. Reported on 09/05/2015    . loratadine (CLARITIN) 10 MG tablet Take 1 tablet (10 mg total) by mouth daily as needed for allergies. 30 tablet 0  . Multiple Vitamin (MULTIVITAMIN) tablet Take 1 tablet by mouth daily. Reported on 09/05/2015     No current facility-administered medications on file prior to visit.     Past Medical History:  Diagnosis Date  . Allergy   . Kidney stone   . LGSIL (low grade squamous intraepithelial dysplasia) 1990   normal paps since    Past Surgical History:  Procedure Laterality Date  . BREAST SURGERY     Reduction  . COLPOSCOPY  1990  . EYE SURGERY     Bilateral  . MIRENA     Inserted 05-16-10    Social History  Substance Use Topics  . Smoking status: Never Smoker  . Smokeless tobacco: Never Used  . Alcohol use Yes     Comment: Rare    Family History  Problem Relation Age  of Onset  . Heart disease Maternal Grandfather   . Heart disease Paternal Grandfather   . Hypertension Paternal Grandfather   . Hypertension Father   . Alzheimer's disease Maternal Grandmother     ROS Per hpi  OBJECTIVE:  Blood pressure 122/84, pulse 71, temperature 98.2 F (36.8 C), temperature source Oral, resp. rate 16, height  (1.6 m), weight 159 lb 3.2 oz (72.2 kg), SpO2 98 %.  Physical Exam  Constitutional: She is oriented to person, place, and time and well-developed, well-nourished, and in no distress.  HENT:  Head: Normocephalic and atraumatic.  Right Ear: Hearing, tympanic membrane, external ear and ear canal normal.  Left Ear: Hearing, external ear and ear canal normal.  Nose: Mucosal edema present.  Mouth/Throat: Oropharynx is clear and moist.  LEFT TM not visualized due to impacted cerumen. Once lavaged, TM Normal  Eyes: Pupils are equal, round, and reactive to light. EOM are normal.  Neck: Neck supple.  Cardiovascular: Normal rate, regular rhythm and normal heart sounds.  Exam reveals no gallop and no friction rub.   No murmur heard. Pulmonary/Chest: Effort normal and breath sounds normal. She has no wheezes. She has no rales.  Lymphadenopathy:    She has no cervical adenopathy.  Neurological: She is alert and oriented to person, place, and time.  Gait normal.  Skin: Skin is warm and dry.      No results found for this or any previous visit (from the past 24 hour(s)).  No results found.   ASSESSMENT and PLAN  1. Impacted cerumen of left ear - Ear wax removal was successful  2. Eustachian tube disorder, left Discussed restarting flonase. Patient educational handout given.  Return if symptoms worsen or fail to improve.    Myles Lipps, MD Primary Care at [facility] [address] [place], [place] [address] Ph.  [phone] Fax [phone]

## 2017-02-21 NOTE — Patient Instructions (Addendum)
1. Start flonase 1-2 sprays in LEFT nostril   Eustachian Tube Dysfunction The eustachian tube connects the middle ear to the back of the nose. It regulates air pressure in the middle ear by allowing air to move between the ear and nose. It also helps to drain fluid from the middle ear space. When the eustachian tube does not function properly, air pressure, fluid, or both can build up in the middle ear. Eustachian tube dysfunction can affect one or both ears. What are the causes? This condition happens when the eustachian tube becomes blocked or cannot open normally. This may result from:  Ear infections.  Colds and other upper respiratory infections.  Allergies.  Irritation, such as from cigarette smoke or acid from the stomach coming up into the esophagus (gastroesophageal reflux).  Sudden changes in air pressure, such as from descending in an airplane.  Abnormal growths in the nose or throat, such as nasal polyps, tumors, or enlarged tissue at the back of the throat (adenoids).  What increases the risk? This condition may be more likely to develop in people who smoke and people who are overweight. Eustachian tube dysfunction may also be more likely to develop in children, especially children who have:  Certain birth defects of the mouth, such as cleft palate.  Large tonsils and adenoids.  What are the signs or symptoms? Symptoms of this condition may include:  A feeling of fullness in the ear.  Ear pain.  Clicking or popping noises in the ear.  Ringing in the ear.  Hearing loss.  Loss of balance.  Symptoms may get worse when the air pressure around you changes, such as when you travel to an area of high elevation or fly on an airplane. How is this diagnosed? This condition may be diagnosed based on:  Your symptoms.  A physical exam of your ear, nose, and throat.  Tests, such as those that measure: ? The movement of your eardrum (tympanogram). ? Your hearing  (audiometry).  How is this treated? Treatment depends on the cause and severity of your condition. If your symptoms are mild, you may be able to relieve your symptoms by moving air into ("popping") your ears. If you have symptoms of fluid in your ears, treatment may include:  Decongestants.  Antihistamines.  Nasal sprays or ear drops that contain medicines that reduce swelling (steroids).  In some cases, you may need to have a procedure to drain the fluid in your eardrum (myringotomy). In this procedure, a small tube is placed in the eardrum to:  Drain the fluid.  Restore the air in the middle ear space.  Follow these instructions at home:  Take over-the-counter and prescription medicines only as told by your health care provider.  Use techniques to help pop your ears as recommended by your health care provider. These may include: ? Chewing gum. ? Yawning. ? Frequent, forceful swallowing. ? Closing your mouth, holding your nose closed, and gently blowing as if you are trying to blow air out of your nose.  Do not do any of the following until your health care provider approves: ? Travel to high altitudes. ? Fly in airplanes. ? Work in a Estate agent or room. ? Scuba dive.  Keep your ears dry. Dry your ears completely after showering or bathing.  Do not smoke.  Keep all follow-up visits as told by your health care provider. This is important. Contact a health care provider if:  Your symptoms do not go away after treatment.  Your symptoms come back after treatment.  You are unable to pop your ears.  You have: ? A fever. ? Pain in your ear. ? Pain in your head or neck. ? Fluid draining from your ear.  Your hearing suddenly changes.  You become very dizzy.  You lose your balance. This information is not intended to replace advice given to you by your health care provider. Make sure you discuss any questions you have with your health care provider. Document  Released: 06/08/2015 Document Revised: 10/18/2015 Document Reviewed: 05/31/2014 Elsevier Interactive Patient Education  2018 [employer].     IF you received an x-ray today, you will receive an invoice from [facility]. Please contact [facility] at [phone] with questions or concerns regarding your invoice.   IF you received labwork today, you will receive an invoice from [place]. Please contact [facility] at [phone] with questions or concerns regarding your invoice.   Our billing staff will not be able to assist you with questions regarding bills from these companies.  You will be contacted with the lab results as soon as they are available. The fastest way to get your results is to activate your My Chart account. Instructions are located on the last page of this paperwork. If you have not heard from [place] regarding the results in 2 weeks, please contact this office.

## 2017-10-20 ENCOUNTER — Encounter: Payer: Self-pay | Admitting: Family Medicine

## 2017-10-20 ENCOUNTER — Other Ambulatory Visit: Payer: Self-pay

## 2017-10-20 ENCOUNTER — Ambulatory Visit (INDEPENDENT_AMBULATORY_CARE_PROVIDER_SITE_OTHER): Payer: BC Managed Care – PPO | Admitting: Family Medicine

## 2017-10-20 VITALS — BP 116/78 | HR 84 | Temp 98.8°F | Ht 63.39 in | Wt 154.0 lb

## 2017-10-20 DIAGNOSIS — Z130 Encounter for screening for diseases of the blood and blood-forming organs and certain disorders involving the immune mechanism: Secondary | ICD-10-CM | POA: Diagnosis not present

## 2017-10-20 DIAGNOSIS — Z13228 Encounter for screening for other metabolic disorders: Secondary | ICD-10-CM

## 2017-10-20 DIAGNOSIS — N951 Menopausal and female climacteric states: Secondary | ICD-10-CM | POA: Diagnosis not present

## 2017-10-20 DIAGNOSIS — Z30432 Encounter for removal of intrauterine contraceptive device: Secondary | ICD-10-CM | POA: Diagnosis not present

## 2017-10-20 DIAGNOSIS — Z01419 Encounter for gynecological examination (general) (routine) without abnormal findings: Secondary | ICD-10-CM

## 2017-10-20 DIAGNOSIS — F411 Generalized anxiety disorder: Secondary | ICD-10-CM

## 2017-10-20 DIAGNOSIS — Z13220 Encounter for screening for lipoid disorders: Secondary | ICD-10-CM | POA: Diagnosis not present

## 2017-10-20 DIAGNOSIS — Z1231 Encounter for screening mammogram for malignant neoplasm of breast: Secondary | ICD-10-CM

## 2017-10-20 DIAGNOSIS — Z23 Encounter for immunization: Secondary | ICD-10-CM

## 2017-10-20 DIAGNOSIS — Z1329 Encounter for screening for other suspected endocrine disorder: Secondary | ICD-10-CM

## 2017-10-20 DIAGNOSIS — E785 Hyperlipidemia, unspecified: Secondary | ICD-10-CM

## 2017-10-20 LAB — POCT URINE PREGNANCY: Preg Test, Ur: NEGATIVE

## 2017-10-20 MED ORDER — ESCITALOPRAM OXALATE 10 MG PO TABS: 10.0000 mg | ORAL_TABLET | Freq: Every day | ORAL | 2 refills | Status: DC

## 2017-10-20 NOTE — Patient Instructions (Addendum)
1. Start with 1/2 tablet ( ) of lexapro once a day. After 2 weeks increase to 1 tab daily.    IF you received an x-ray today, you will receive an invoice from [facility]. Please contact [facility] at [phone] with questions or concerns regarding your invoice.   IF you received labwork today, you will receive an invoice from [place]. Please contact [facility] at [phone] with questions or concerns regarding your invoice.   Our billing staff will not be able to assist you with questions regarding bills from these companies.  You will be contacted with the lab results as soon as they are available. The fastest way to get your results is to activate your My Chart account. Instructions are located on the last page of this paperwork. If you have not heard from [place] regarding the results in 2 weeks, please contact this office.

## 2017-10-20 NOTE — Progress Notes (Signed)
5/28/20198:54 AM  Diana Walsh [birth year], 52 y.o. female [id number]  Chief Complaint  Patient presents with  . Gynecologic Exam     would like to discuss some anxiety concerns that she has beeb having for the past few mos.    HPI:   Patient is a 52 y.o. female who presents today for CPE  Cervical Cancer Screening: 11/2011, neg pap and HPV Breast Cancer Screening: 2017, normal Colorectal Cancer Screening: n/a Bone Density Testing: n/a HIV Screening: declines STI Screening: declines Seasonal Influenza Vaccination: gets every flu season thru school Td/Tdap Vaccination: needs today Pneumococcal Vaccination: n/a Zoster Vaccination: n/a Frequency of Dental evaluation: Q6 months Frequency of Eye evaluation: yearly  IUD inserted in 2011, scant irregular spotting, having increased anxiety, denies hot flashes or vaginal dryness GAD 7 = 11 Exercises regularly, plays tennis, walks, active life style Healthy diet Does not smoke  Fall Risk  10/20/2017 02/21/2017 09/08/2016 09/05/2015  Falls in the past year? No No No No     Depression screen [facility] 2/9 10/20/2017 02/21/2017 09/08/2016  Decreased Interest 0 0 0  Down, Depressed, Hopeless 0 0 0  PHQ - 2 Score 0 0 0    Allergies  Allergen Reactions  . Penicillins Nausea And Vomiting    Has patient had a PCN reaction causing immediate rash, facial/tongue/throat swelling, SOB or lightheadedness with hypotension: No Has patient had a PCN reaction causing severe rash involving mucus membranes or skin necrosis: No Has patient had a PCN reaction that required hospitalization No Has patient had a PCN reaction occurring within the last 10 years: No If all of the above answers are "NO", then may proceed with Cephalosporin use.   . Codeine Palpitations    Prior to Admission medications   Medication Sig Start Date End Date Taking? Authorizing Provider  fluticasone (FLONASE) 50 MCG/ACT nasal spray Place 2 sprays into both nostrils daily as  needed for allergies or rhinitis. 10/02/15  Yes Zannie Cove, MD  levonorgestrel (MIRENA) 20 MCG/24HR IUD 1 each by Intrauterine route once. Reported on 09/05/2015   Yes [provider]  loratadine (CLARITIN) 10 MG tablet Take 1 tablet (10 mg total) by mouth daily as needed for allergies. 10/02/15  Yes Zannie Cove, MD  Multiple Vitamin (MULTIVITAMIN) tablet Take 1 tablet by mouth daily. Reported on 09/05/2015   Yes [provider]    Past Medical History:  Diagnosis Date  . Allergy   . Kidney stone   . LGSIL (low grade squamous intraepithelial dysplasia) 1990   normal paps since    Past Surgical History:  Procedure Laterality Date  . BREAST SURGERY     Reduction  . COLPOSCOPY  1990  . EYE SURGERY     Bilateral  . MIRENA     Inserted 05-16-10    Social History   Tobacco Use  . Smoking status: Never Smoker  . Smokeless tobacco: Never Used  Substance Use Topics  . Alcohol use: Yes    Comment: Rare    Family History  Problem Relation Age of Onset  . Heart disease Maternal Grandfather   . Heart disease Paternal Grandfather   . Hypertension Paternal Grandfather   . Hypertension Father   . Alzheimer's disease Maternal Grandmother     Review of Systems  Constitutional: Negative for chills and fever.  Respiratory: Negative for cough and shortness of breath.   Cardiovascular: Negative for chest pain, palpitations and leg swelling.  Gastrointestinal: Negative for abdominal pain, nausea and vomiting.  Genitourinary:  Neg breast lumps or nipple discharge Neg vaginal discharge, pelvic pain, dyspareunia, abnormal vaginal bleeding  Psychiatric/Behavioral: Negative for depression. The patient is nervous/anxious.   All other systems reviewed and are negative.    OBJECTIVE:  Blood pressure 116/78, pulse 84, temperature 98.8 F (37.1 C), temperature source Oral, height 5' 3.39" (1.61 m), weight 154 lb (69.9 kg), SpO2 98 %.  Physical Exam    Constitutional: She is oriented to person, place, and time. She appears well-developed and well-nourished.  HENT:  Head: Normocephalic and atraumatic.  Right Ear: Hearing, tympanic membrane, external ear and ear canal normal.  Left Ear: Hearing, tympanic membrane, external ear and ear canal normal.  Mouth/Throat: Oropharynx is clear and moist.  Eyes: Pupils are equal, round, and reactive to light. Conjunctivae and EOM are normal.  Neck: Neck supple. No thyromegaly present.  Cardiovascular: Normal rate, regular rhythm, normal heart sounds and intact distal pulses. Exam reveals no gallop and no friction rub.  No murmur heard. Pulmonary/Chest: Effort normal and breath sounds normal. She has no wheezes. She has no rhonchi. She has no rales. Right breast exhibits no inverted nipple, no mass, no nipple discharge, no skin change and no tenderness. Left breast exhibits no inverted nipple, no mass, no nipple discharge, no skin change and no tenderness. Breasts are symmetrical.  Abdominal: Soft. Bowel sounds are normal. She exhibits no distension. There is no hepatosplenomegaly. There is no tenderness. There is no guarding.  Genitourinary: There is no rash or lesion on the right labia. There is no rash or lesion on the left labia. Uterus is not enlarged, not fixed and not tender. Cervix exhibits no motion tenderness, no discharge and no friability. Right adnexum displays no mass and no tenderness. Left adnexum displays no mass and no tenderness. No erythema in the vagina. No vaginal discharge found.  Genitourinary Comments: IUD strings visualized in cervical OS, removed easily with ring forceps without any issues  Musculoskeletal: Normal range of motion. She exhibits no edema.  Lymphadenopathy:    She has no cervical adenopathy.    She has no axillary adenopathy.       Right: No supraclavicular adenopathy present.       Left: No supraclavicular adenopathy present.  Neurological: She is alert and oriented  to person, place, and time. She has normal reflexes.  Skin: Skin is warm and dry.  Psychiatric: She has a normal mood and affect.  Nursing note and vitals reviewed.   Results for orders placed or performed in visit on 10/20/17 (from the past 24 hour(s))  POCT urine pregnancy     Status: Normal   Collection Time: 10/20/17  9:36 AM  Result Value Ref Range   Preg Test, Ur Negative Negative     ASSESSMENT and PLAN  1. Women's annual routine gynecological examination No concerns per history or exam. Routine HCM labs ordered. HCM reviewed/discussed. Anticipatory guidance regarding healthy weight, lifestyle and choices given.  - Pap IG and HPV (high risk) DNA detection  2. Screening for deficiency anemia - CBC  3. Screening for lipid disorders - Lipid panel  4. Screening for metabolic disorder - Comprehensive metabolic panel  5. Screening for thyroid disorder - TSH  6. Visit for screening mammogram - MM DIGITAL SCREENING BILATERAL; Future  7. Anxiety state Might be related to perimenopause or not. Starting lexapro which would also help with hot flashes if these were to occur. Discussed new med r/se//b.   8. Perimenopausal symptom - Follicle Stimulating Hormone - POCT urine  pregnancy  9. Need for prophylactic vaccination with tetanus-diphtheria (Td)  10. Encounter for removal of intrauterine contraceptive device (IUD) IUD in place for 8 years, removed today without any issues. Consider transitioning pending FSH results.   Other orders - Td vaccine greater than or equal to 7yo preservative free IM - escitalopram (LEXAPRO) 10 MG tablet; Take 1 tablet (10 mg total) by mouth daily.  Return in about 1 month (around 11/17/2017).    Myles Lipps, MD Primary Care at [facility] [address] [place], [place] [address] Ph.  [phone] Fax [phone]

## 2017-10-21 LAB — COMPREHENSIVE METABOLIC PANEL
ALT: 11 IU/L (ref 0–32)
AST: 11 IU/L (ref 0–40)
Albumin/Globulin Ratio: 1.9 (ref 1.2–2.2)
Albumin: 4.3 g/dL (ref 3.5–5.5)
Alkaline Phosphatase: 92 IU/L (ref 39–117)
BUN/Creatinine Ratio: 20 (ref 9–23)
BUN: 16 mg/dL (ref 6–24)
Bilirubin Total: 0.2 mg/dL (ref 0.0–1.2)
CO2: 21 mmol/L (ref 20–29)
Calcium: 9.7 mg/dL (ref 8.7–10.2)
Chloride: 105 mmol/L (ref 96–106)
Creatinine, Ser: 0.8 mg/dL (ref 0.57–1.00)
GFR calc Af Amer: 100 mL/min/{1.73_m2} (ref >59)
GFR calc non Af Amer: 87 mL/min/{1.73_m2} (ref >59)
Globulin, Total: 2.3 g/dL (ref 1.5–4.5)
Glucose: 83 mg/dL (ref 65–99)
Potassium: 4.8 mmol/L (ref 3.5–5.2)
Sodium: 139 mmol/L (ref 134–144)
Total Protein: 6.6 g/dL (ref 6.0–8.5)

## 2017-10-21 LAB — CBC
Hematocrit: 39.8 % (ref 34.0–46.6)
Hemoglobin: 12.9 g/dL (ref 11.1–15.9)
MCH: 29.5 pg (ref 26.6–33.0)
MCHC: 32.4 g/dL (ref 31.5–35.7)
MCV: 91 fL (ref 79–97)
Platelets: 370 10*3/uL (ref 150–450)
RBC: 4.38 x10E6/uL (ref 3.77–5.28)
RDW: 13.6 % (ref 12.3–15.4)
WBC: 6.7 10*3/uL (ref 3.4–10.8)

## 2017-10-21 LAB — LIPID PANEL
Chol/HDL Ratio: 3.8 ratio (ref 0.0–4.4)
Cholesterol, Total: 237 mg/dL — ABNORMAL HIGH (ref 100–199)
HDL: 63 mg/dL (ref >39)
LDL Calculated: 159 mg/dL — ABNORMAL HIGH (ref 0–99)
Triglycerides: 73 mg/dL (ref 0–149)
VLDL Cholesterol Cal: 15 mg/dL (ref 5–40)

## 2017-10-21 LAB — FOLLICLE STIMULATING HORMONE: FSH: 6.3 m[IU]/mL

## 2017-10-21 LAB — TSH: TSH: 1.96 u[IU]/mL (ref 0.450–4.500)

## 2017-10-22 LAB — PAP IG AND HPV HIGH-RISK
HPV, high-risk: NEGATIVE
PAP Smear Comment: 0

## 2017-11-20 ENCOUNTER — Other Ambulatory Visit: Payer: Self-pay

## 2017-11-20 ENCOUNTER — Ambulatory Visit: Payer: BC Managed Care – PPO | Admitting: Family Medicine

## 2017-11-20 ENCOUNTER — Encounter: Payer: Self-pay | Admitting: Family Medicine

## 2017-11-20 VITALS — BP 112/84 | HR 85 | Temp 98.7°F | Ht 63.0 in | Wt 152.4 lb

## 2017-11-20 DIAGNOSIS — E785 Hyperlipidemia, unspecified: Secondary | ICD-10-CM | POA: Diagnosis not present

## 2017-11-20 DIAGNOSIS — Z3009 Encounter for other general counseling and advice on contraception: Secondary | ICD-10-CM

## 2017-11-20 DIAGNOSIS — F411 Generalized anxiety disorder: Secondary | ICD-10-CM

## 2017-11-20 NOTE — Progress Notes (Signed)
6/28/20198:24 AM  Annye English 08-08-1965, 52 y.o. female [id number]  Chief Complaint  Patient presents with  . Follow-up    Doing well on the Lexapro. Looking to have another IUD placed    HPI:   Patient is a 52 y.o. female with past medical history significant for anxiety who presents today for followup  Doing well on lexapro 5mg . Anxiety well controlled. Tried to increase to 10mg  but it was too sedating.   Requesting review of labs Normal FSH, not in menopause - requesting another mirena. Using condoms for now. Has had her first period off mirena no anemia normal electrolytes, kidney, liver and thyroid  Pap and hpv negative, repeat cotesting in 5 years LDL 159, worse.prior 104  Fall Risk  11/20/2017 10/20/2017 02/21/2017 09/08/2016 09/05/2015  Falls in the past year? No No No No No     Depression screen [facility] 2/9 11/20/2017 10/20/2017 02/21/2017  Decreased Interest 0 0 0  Down, Depressed, Hopeless 0 0 0  PHQ - 2 Score 0 0 0    Allergies  Allergen Reactions  . Penicillins Nausea And Vomiting    Has patient had a PCN reaction causing immediate rash, facial/tongue/throat swelling, SOB or lightheadedness with hypotension: NO Has patient had a PCN reaction causing severe rash involving mucus membranes or skin necrosis: NO Has patient had a PCN reaction that required hospitalization No Has patient had a PCN reaction occurring within the last 10 years: No If all of the above answers are "NO", then may proceed with Cephalosporin use.   . Codeine Palpitations    Prior to Admission medications   Medication Sig Start Date End Date Taking? Authorizing Provider  escitalopram (LEXAPRO) 10 MG tablet Take 1 tablet (10 mg total) by mouth daily. 10/20/17   Myles Lipps, MD  fluticasone Aleda Grana) 50 MCG/ACT nasal spray Place 2 sprays into both nostrils daily as needed for allergies or rhinitis. 10/02/15   Zannie Cove, MD  levonorgestrel (MIRENA) 20 MCG/24HR IUD 1 each by  Intrauterine route once. Reported on 09/05/2015    [provider]  loratadine (CLARITIN) 10 MG tablet Take 1 tablet (10 mg total) by mouth daily as needed for allergies. 10/02/15   Zannie Cove, MD  Multiple Vitamin (MULTIVITAMIN) tablet Take 1 tablet by mouth daily. Reported on 09/05/2015    [provider]    Past Medical History:  Diagnosis Date  . Allergy   . Kidney stone   . LGSIL (low grade squamous intraepithelial dysplasia) 1990   normal paps since    Past Surgical History:  Procedure Laterality Date  . BREAST SURGERY     Reduction  . COLPOSCOPY  1990  . EYE SURGERY     Bilateral  . MIRENA     Inserted 05-16-10    Social History   Tobacco Use  . Smoking status: Never Smoker  . Smokeless tobacco: Never Used  Substance Use Topics  . Alcohol use: Yes    Comment: Rare    Family History  Problem Relation Age of Onset  . Heart disease Maternal Grandfather   . Heart disease Paternal Grandfather   . Hypertension Paternal Grandfather   . Hypertension Father   . Alzheimer's disease Maternal Grandmother     ROS Per hpi  OBJECTIVE:  Blood pressure 112/84, pulse 85, temperature 98.7 F (37.1 C), temperature source Oral, height 5\' 3"  (1.6 m), weight 152 lb 6.4 oz (69.1 kg), SpO2 97 %.  Physical Exam  Constitutional: She is oriented to  person, place, and time.  HENT:  Head: Normocephalic and atraumatic.  Mouth/Throat: Mucous membranes are normal.  Eyes: Pupils are equal, round, and reactive to light. EOM are normal. No scleral icterus.  Neck: Neck supple.  Pulmonary/Chest: Effort normal.  Neurological: She is alert and oriented to person, place, and time.  Skin: Skin is warm and dry.  Psychiatric: She has a normal mood and affect.  Nursing note and vitals reviewed.    ASSESSMENT and PLAN  1. Anxiety state Controlled. Continue current regime.   2. Dyslipidemia Worse. discussed LFM. Recheck in 6 months  3. General counseling and advice  for contraceptive management Initiated process for IUD request. Patient will be contacted with appt for insertion.  Return IUD insertion.    Myles LippsIrma M Santiago, MD Primary Care at [facility] [address] [place], [place] [address] Ph.  [phone] Fax [phone]

## 2017-11-20 NOTE — Patient Instructions (Signed)
     IF you received an x-ray today, you will receive an invoice from [facility]. Please contact [facility] at [phone] with questions or concerns regarding your invoice.   IF you received labwork today, you will receive an invoice from [facility]. Please contact [facility] at [phone] with questions or concerns regarding your invoice.   Our billing staff will not be able to assist you with questions regarding bills from these companies.  You will be contacted with the lab results as soon as they are available. The fastest way to get your results is to activate your My Chart account. Instructions are located on the last page of this paperwork. If you have not heard from us regarding the results in 2 weeks, please contact this office.     

## 2017-12-02 ENCOUNTER — Encounter: Payer: Self-pay | Admitting: Family Medicine

## 2017-12-04 ENCOUNTER — Ambulatory Visit: Payer: BC Managed Care – PPO | Admitting: Gynecology

## 2017-12-08 ENCOUNTER — Telehealth: Payer: Self-pay | Admitting: Family Medicine

## 2017-12-08 NOTE — Telephone Encounter (Signed)
Called pt to try and make an appt with Dr. Leretha PolSantiago to place her mirena. While speaking with pt, she received a call and had to take it.  When pt calls back, please schedule her with Dr. Leretha PolSantiago at her convenience for an OV to place the mirena.  Thank you!

## 2017-12-14 ENCOUNTER — Encounter: Payer: Self-pay | Admitting: Family Medicine

## 2017-12-14 ENCOUNTER — Other Ambulatory Visit: Payer: Self-pay

## 2017-12-14 ENCOUNTER — Ambulatory Visit: Payer: BC Managed Care – PPO | Admitting: Family Medicine

## 2017-12-14 VITALS — BP 123/80 | HR 70 | Temp 99.4°F | Resp 16 | Ht 63.0 in | Wt 149.8 lb

## 2017-12-14 DIAGNOSIS — Z30430 Encounter for insertion of intrauterine contraceptive device: Secondary | ICD-10-CM | POA: Diagnosis not present

## 2017-12-14 DIAGNOSIS — F411 Generalized anxiety disorder: Secondary | ICD-10-CM | POA: Insufficient documentation

## 2017-12-14 HISTORY — PX: INTRAUTERINE DEVICE INSERTION: SHX323

## 2017-12-14 HISTORY — DX: Encounter for insertion of intrauterine contraceptive device: Z30.430

## 2017-12-14 LAB — POCT URINE PREGNANCY: Preg Test, Ur: NEGATIVE

## 2017-12-14 MED ORDER — LEVONORGESTREL 20 MCG/24HR IU IUD: 1.0000 | INTRAUTERINE_SYSTEM | Freq: Once | INTRAUTERINE | 0 refills | Status: AC

## 2017-12-14 MED ORDER — ESCITALOPRAM OXALATE 20 MG PO TABS: 20.0000 mg | ORAL_TABLET | Freq: Every day | ORAL | 3 refills | Status: DC

## 2017-12-14 NOTE — Patient Instructions (Signed)

## 2017-12-14 NOTE — Progress Notes (Signed)
7/22/201911:02 AM  Annye EnglishMichelle Retz Jan 02, 1966, 52 y.o. female [id number]  Chief Complaint  Patient presents with  . mirena placement    today is pt's birthday    HPI:   Patient is a 52 y.o. female with past medical history significant for anxiety who presents today for IUD insertion  Counseling done during last visit Has had mirena before and was happy with method Menses continue to be regular, no signs of premenopause Denies any pelvic pain, vaginal discharge, fever, chills Would like to increase lexapro, anxiety better but not fully controlled ye Denies any side effects to lexapro  Fall Risk  12/14/2017 11/20/2017 10/20/2017 02/21/2017 09/08/2016  Falls in the past year? No No No No No     [facility] 12/14/2017 11/20/2017 10/20/2017  Decreased Interest 0 0 0  Down, Depressed, Hopeless 0 0 0  PHQ - 2 Score 0 0 0    Allergies  Allergen Reactions  . Penicillins Nausea And Vomiting    Has patient had a PCN reaction causing immediate rash, facial/tongue/throat swelling, SOB or lightheadedness with hypotension: No Has patient had a PCN reaction causing severe rash involving mucus membranes or skin necrosis: No Has patient had a PCN reaction that required hospitalization No Has patient had a PCN reaction occurring within the last 10 years: No If all of the above answers are "NO", then may proceed with Cephalosporin use.   . Codeine Palpitations    Prior to Admission medications   Medication Sig Start Date End Date Taking? Authorizing Provider  escitalopram (LEXAPRO) 10 MG tablet Take 1 tablet (10 mg total) by mouth daily. 10/20/17  Yes Myles LippsSantiago, Meilyn Heindl M, MD  fluticasone [facility](FLONASE) 50 MCG/ACT nasal spray Place 2 sprays into both nostrils daily as needed for allergies or rhinitis. 10/02/15  Yes Zannie CoveJoseph, Preetha, MD  loratadine (CLARITIN) 10 MG tablet Take 1 tablet (10 mg total) by mouth daily as needed for allergies. 10/02/15  Yes Zannie CoveJoseph, Preetha, MD  Multiple Vitamin  (MULTIVITAMIN) tablet Take 1 tablet by mouth daily. Reported on 09/05/2015   Yes [provider]    Past Medical History:  Diagnosis Date  . Allergy   . Hyperlipidemia   . Kidney stone   . LGSIL (low grade squamous intraepithelial dysplasia) 1990   normal paps since    Past Surgical History:  Procedure Laterality Date  . BREAST SURGERY     Reduction  . COLPOSCOPY  1990  . EYE SURGERY     Bilateral    Social History   Tobacco Use  . Smoking status: Never Smoker  . Smokeless tobacco: Never Used  Substance Use Topics  . Alcohol use: Yes    Comment: Rare    Family History  Problem Relation Age of Onset  . Heart disease Maternal Grandfather   . Heart disease Paternal Grandfather   . Hypertension Paternal Grandfather   . Hypertension Father   . Alzheimer's disease Maternal Grandmother     ROS Per hpi  OBJECTIVE:  Blood pressure 123/80, pulse 70, temperature 99.4 F (37.4 C), temperature source Oral, resp. rate 16, height 5\' 3"  (1.6 m), weight 149 lb 12.8 oz (67.9 kg), last menstrual period 12/07/2017, SpO2 99 %. Body mass index is 26.54 kg/m.   Physical Exam  Constitutional: She is oriented to person, place, and time. She appears well-developed and well-nourished.  HENT:  Head: Normocephalic and atraumatic.  Mouth/Throat: Mucous membranes are normal.  Eyes: Pupils are equal, round, and reactive to light. EOM are  normal. No scleral icterus.  Neck: Neck supple.  Pulmonary/Chest: Effort normal.  Neurological: She is alert and oriented to person, place, and time.  Skin: Skin is warm and dry.  Psychiatric: She has a normal mood and affect.  Nursing note and vitals reviewed.   Procedure Note: Written consent obtained. She understood risks of IUD placement to include bleeding, infection, uterine perforation, risk of expulsion, risk of failure < 1%, increased risk of ectopic pregnancy in the event of failure.  Prior to the procedure being performed, the  patient (or guardian) was asked to state their full name, date of birth, type of procedure being performed and the exact location of the operative site. This information was then checked against the documentation in the patient's chart. Prior to the procedure being performed, a "time out" was performed by the physician that confirmed the correct patient, procedure and site.   A bimanual exam was performed to establish the position of the uterus.  A speculum was placed in the vagina and the cervix was cleaned with betadine.  A single tooth tenaculum was placed at the anterior lip of the cervix and the uterus was sounded to 7 cm.   The IUD was then carefully inserted into the uterus. The Mirena device was released into the uterine cavity and the applicator withdrawn. The string was cut to the appropriate length.  The tenaculum was removed and the speculum withdrawn. The patient tolerated the procedure well.  Lot no. [id number] Exp. Nov 2021  Results for orders placed or performed in visit on 12/14/17 (from the past 24 hour(s))  POCT urine pregnancy     Status: Normal   Collection Time: 12/14/17 10:43 AM  Result Value Ref Range   Preg Test, Ur Negative Negative      ASSESSMENT and PLAN  1. Encounter for insertion of intrauterine contraceptive device (IUD)\ Patient tolerated well. Patient educational booklet given. Routine care and precautions reviewed - POCT urine pregnancy  2. Generalized anxiety disorder - increase lexapro to 20mg  once a day    Return in about 1 month (around 01/11/2018) for anxiety/string check.    Myles Lipps, MD Primary Care at [facility] [address] [place], [place] [address] Ph.  [phone] Fax [phone]

## 2018-09-21 ENCOUNTER — Telehealth (INDEPENDENT_AMBULATORY_CARE_PROVIDER_SITE_OTHER): Payer: BC Managed Care – PPO | Admitting: Family Medicine

## 2018-09-21 ENCOUNTER — Other Ambulatory Visit: Payer: Self-pay

## 2018-09-21 ENCOUNTER — Encounter: Payer: Self-pay | Admitting: Family Medicine

## 2018-09-21 VITALS — BP 146/83 | HR 72 | Temp 98.4°F | Resp 17 | Ht 63.0 in | Wt 164.4 lb

## 2018-09-21 DIAGNOSIS — R599 Enlarged lymph nodes, unspecified: Secondary | ICD-10-CM | POA: Diagnosis not present

## 2018-09-21 DIAGNOSIS — W57XXXA Bitten or stung by nonvenomous insect and other nonvenomous arthropods, initial encounter: Secondary | ICD-10-CM | POA: Diagnosis not present

## 2018-09-21 NOTE — Progress Notes (Signed)
CC: lump on neck HPI   Patient reports that she pulled off a tick off the back of her neck on Friday, 4 days ago, and now she notices a tender lump on the side of the neck near her hairline on the left side that has been there for 2 days. She denies fevers, chills or rash No body aches  No runny nose  No fatigue   Past Medical History:  Diagnosis Date  . Allergy   . Encounter for insertion of mirena IUD 12/14/2017  . Hyperlipidemia   . Kidney stone   . LGSIL (low grade squamous intraepithelial dysplasia) 1990   normal paps since    Current Outpatient Medications  Medication Sig Dispense Refill  . escitalopram (LEXAPRO) 20 MG tablet Take 1 tablet (20 mg total) by mouth daily. 30 tablet 3  . fluticasone (FLONASE) 50 MCG/ACT nasal spray Place 2 sprays into both nostrils daily as needed for allergies or rhinitis.    Marland Kitchen. loratadine (CLARITIN) 10 MG tablet Take 1 tablet (10 mg total) by mouth daily as needed for allergies. 30 tablet 0  . Multiple Vitamin (MULTIVITAMIN) tablet Take 1 tablet by mouth daily. Reported on 09/05/2015    . levonorgestrel (MIRENA, 52 MG,) 20 MCG/24HR IUD 1 Intra Uterine Device (1 each total) by Intrauterine route once for 1 dose. Inserted today in clinic 1 each 0   No current facility-administered medications for this visit.     Allergies:  Allergies  Allergen Reactions  . Penicillins Nausea And Vomiting    Has patient had a PCN reaction causing immediate rash, facial/tongue/throat swelling, SOB or lightheadedness with hypotension: Has patient had a PCN reaction causing severe rash involving mucus membranes or skin necrosis:  Has patient had a PCN reaction that required hospitalization No Has patient had a PCN reaction occurring within the last 10 years: No If all of the above answers are "NO", then may proceed with Cephalosporin use.   . Codeine Palpitations    Past Surgical History:  Procedure Laterality Date  . BREAST SURGERY     Reduction  .  COLPOSCOPY  1990  . EYE SURGERY     Bilateral  . INTRAUTERINE DEVICE INSERTION  12/14/2017   Mirena    Social History   Socioeconomic History  . Marital status: Married    Spouse name: Not on file  . Number of children: Not on file  . Years of education: Not on file  . Highest education level: Not on file  Occupational History  . Not on file  Social Needs  . Financial resource strain: Not on file  . Food insecurity:    Worry: Not on file    Inability: Not on file  . Transportation needs:    Medical: Not on file    Non-medical: Not on file  Tobacco Use  . Smoking status: Never Smoker  . Smokeless tobacco: Never Used  Substance and Sexual Activity  . Alcohol use: Yes    Comment: Rare  . Drug use: No  . Sexual activity: Yes    Comment: MIRENA inserted 05-16-10  Lifestyle  . Physical activity:    Days per week: Not on file    Minutes per session: Not on file  . Stress: Not on file  Relationships  . Social connections:    Talks on phone: Not on file    Gets together: Not on file    Attends religious service: Not on file    Active member of  club or organization: Not on file    Attends meetings of clubs or organizations: Not on file    Relationship status: Not on file  Other Topics Concern  . Not on file  Social History Narrative  . Not on file    Family History  Problem Relation Age of Onset  . Heart disease Maternal Grandfather   . Heart disease Paternal Grandfather   . Hypertension Paternal Grandfather   . Hypertension Father   . Alzheimer's disease Maternal Grandmother      ROS Review of Systems See HPI Constitution: No fevers or chills No malaise No diaphoresis Skin: No rash or itching Eyes: no blurry vision, no double vision Neuro: no dizziness or headaches all others reviewed and negative   Objective: Vitals:   09/21/18 1407  BP: (!) 146/83  Pulse: 72  Resp: 17  Temp: 98.4 F (36.9 C)  TempSrc: Oral  SpO2: 96%  Weight: 164 lb 6.4 oz  (74.6 kg)  Height: 5\' 3"  (1.6 m)    Physical Exam General: alert, oriented, in NAD Head: normocephalic, atraumatic, no sinus tenderness Eyes: EOM intact, no scleral icterus or conjunctival injection Ears: TM clear bilaterally Nose: mucosa nonerythematous, nonedematous Throat: no pharyngeal exudate or erythema Lymph: + left posterior auricular lymphadenopathy, no submental or cervical lymphadenopathy Heart: normal rate, normal sinus rhythm, no murmurs Lungs: clear to auscultation bilaterally, no wheezing Skin: small erythematous papule on posterior neck near the hairline  Assessment and Plan Diagnoses and all orders for this visit:  Enlarged lymph nodes  Tick bite, initial encounter   Advised supportive care Advised to stop touching the lymph node If fevers then call back  Discussed doxycycline but since she is otherwise asymptomatic would have patient monitor  Visit occurred face-to-face in the office lasting 15 minutes   Elynn Patteson A [place]

## 2018-09-21 NOTE — Progress Notes (Signed)
CC: Knot/lump on left side of neck, not near lymph nodes x 1-2 days, sore/tender to touch but no pain with movement of neck.  No recent weight or bp taken.  No travel outside the [place] or [place] in the past 3 weeks.

## 2018-09-21 NOTE — Patient Instructions (Signed)
° ° ° °  If you have lab work done today you will be contacted with your lab results within the next 2 weeks.  If you have not heard from us then please contact us. The fastest way to get your results is to register for My Chart. ° ° °IF you received an x-ray today, you will receive an invoice from [facility]. Please contact [facility] at [phone] with questions or concerns regarding your invoice.  ° °IF you received labwork today, you will receive an invoice from [facility]. Please contact [facility] at [phone] with questions or concerns regarding your invoice.  ° °Our billing staff will not be able to assist you with questions regarding bills from these companies. ° °You will be contacted with the lab results as soon as they are available. The fastest way to get your results is to activate your My Chart account. Instructions are located on the last page of this paperwork. If you have not heard from us regarding the results in 2 weeks, please contact this office. °  ° ° ° °

## 2019-02-16 ENCOUNTER — Encounter: Payer: Self-pay | Admitting: Gynecology

## 2019-04-04 ENCOUNTER — Ambulatory Visit: Payer: BC Managed Care – PPO | Admitting: Family Medicine

## 2019-04-04 ENCOUNTER — Other Ambulatory Visit: Payer: Self-pay

## 2019-04-04 ENCOUNTER — Encounter: Payer: Self-pay | Admitting: Family Medicine

## 2019-04-04 VITALS — BP 130/72 | HR 83 | Temp 99.2°F | Ht 63.0 in | Wt 164.0 lb

## 2019-04-04 DIAGNOSIS — R8290 Unspecified abnormal findings in urine: Secondary | ICD-10-CM | POA: Diagnosis not present

## 2019-04-04 DIAGNOSIS — N393 Stress incontinence (female) (male): Secondary | ICD-10-CM | POA: Diagnosis not present

## 2019-04-04 LAB — POCT URINALYSIS DIP (MANUAL ENTRY)
Bilirubin, UA: NEGATIVE
Glucose, UA: NEGATIVE mg/dL
Ketones, POC UA: NEGATIVE mg/dL
Nitrite, UA: NEGATIVE
Protein Ur, POC: NEGATIVE mg/dL
Spec Grav, UA: 1.025 (ref 1.010–1.025)
Urobilinogen, UA: 0.2 E.U./dL
pH, UA: 7 (ref 5.0–8.0)

## 2019-04-04 NOTE — Patient Instructions (Addendum)
If you have lab work done today you will be contacted with your lab results within the next 2 weeks.  If you have not heard from [place] then please contact us. The fastest way to get your results is to register for My Chart.   IF you received an x-ray today, you will receive an invoice from [facility]. Please contact [facility] at [phone] with questions or concerns regarding your invoice.   IF you received labwork today, you will receive an invoice from [facility]. Please contact [facility] at [phone] with questions or concerns regarding your invoice.   Our billing staff will not be able to assist you with questions regarding bills from these companies.  You will be contacted with the lab results as soon as they are available. The fastest way to get your results is to activate your My Chart account. Instructions are located on the last page of this paperwork. If you have not heard from [place] regarding the results in 2 weeks, please contact this office.      Urinary Incontinence  Urinary incontinence refers to a condition in which a person is unable to control where and when to pass urine. A person with this condition will urinate when he or she does not mean to (involuntarily). What are the causes? This condition may be caused by:  Medicines.  Infections.  Constipation.  Overactive bladder muscles.  Weak bladder muscles.  Weak pelvic floor muscles. These muscles provide support for the bladder, intestine, and, in women, the uterus.  Enlarged prostate in men. The prostate is a gland near the bladder. When it gets too big, it can pinch the urethra. With the urethra blocked, the bladder can weaken and lose the ability to empty properly.  Surgery.  Emotional factors, such as anxiety, stress, or post-traumatic stress disorder (PTSD).  Pelvic organ prolapse. This happens in women when organs shift out of place and into the vagina. This shift can prevent the  bladder and urethra from working properly. What increases the risk? The following factors may make you more likely to develop this condition:  Older age.  Obesity and physical inactivity.  Pregnancy and childbirth.  Menopause.  Diseases that affect the nerves or spinal cord (neurological diseases).  Long-term (chronic) coughing. This can increase pressure on the bladder and pelvic floor muscles. What are the signs or symptoms? Symptoms may vary depending on the type of urinary incontinence you have. They include:  A sudden urge to urinate, but passing urine involuntarily before you can get to a bathroom (urge incontinence).  Suddenly passing urine with any activity that forces urine to pass, such as coughing, laughing, exercise, or sneezing (stress incontinence).  Needing to urinate often, but urinating only a small amount, or constantly dribbling urine (overflow incontinence).  Urinating because you cannot get to the bathroom in time due to a physical disability, such as arthritis or injury, or communication and thinking problems, such as Alzheimer disease (functional incontinence). How is this diagnosed? This condition may be diagnosed based on:  Your medical history.  A physical exam.  Tests, such as: ? Urine tests. ? X-rays of your kidney and bladder. ? Ultrasound. ? CT scan. ? Cystoscopy. In this procedure, a health care provider inserts a tube with a light and camera (cystoscope) through the urethra and into the bladder in order to check for problems. ? Urodynamic testing. These tests assess how well the bladder, urethra, and sphincter can store and release urine. There are  different types of urodynamic tests, and they vary depending on what the test is measuring. To help diagnose your condition, your health care provider may recommend that you keep a log of when you urinate and how much you urinate. How is this treated? Treatment for this condition depends on the type  of incontinence that you have and its cause. Treatment may include:  Lifestyle changes, such as: ? Quitting smoking. ? Maintaining a healthy weight. ? Staying active. Try to get 150 minutes of moderate-intensity exercise every week. Ask your health care provider which activities are safe for you. ? Eating a healthy diet.  Avoid high-fat foods, like fried foods.  Avoid refined carbohydrates like white bread and white rice.  Limit how much alcohol and caffeine you drink.  Increase your fiber intake. Foods such as fresh fruits, vegetables, beans, and whole grains are healthy sources of fiber.  Pelvic floor muscle exercises.  Bladder training, such as lengthening the amount of time between bathroom breaks, or using the bathroom at regular intervals.  Using techniques to suppress bladder urges. This can include distraction techniques or controlled breathing exercises.  Medicines to relax the bladder muscles and prevent bladder spasms.  Medicines to help slow or prevent the growth of a man's prostate.  Botox injections. These can help relax the bladder muscles.  Using pulses of electricity to help change bladder reflexes (electrical nerve stimulation).  For women, using a medical device to prevent urine leaks. This is a small, tampon-like, disposable device that is inserted into the urethra.  Injecting collagen or carbon beads (bulking agents) into the urinary sphincter. These can help thicken tissue and close the bladder opening.  Surgery. Follow these instructions at home: Lifestyle  Limit alcohol and caffeine. These can fill your bladder quickly and irritate it.  Keep yourself clean to help prevent odors and skin damage. Ask your doctor about special skin creams and cleansers that can protect the skin from urine.  Consider wearing pads or adult diapers. Make sure to change them regularly, and always change them right after experiencing incontinence. General  instructions  Take over-the-counter and prescription medicines only as told by your health care provider.  Use the bathroom about every 3-4 hours, even if you do not feel the need to urinate. Try to empty your bladder completely every time. After urinating, wait a minute. Then try to urinate again.  Make sure you are in a relaxed position while urinating.  If your incontinence is caused by nerve problems, keep a log of the medicines you take and the times you go to the bathroom.  Keep all follow-up visits as told by your health care provider. This is important. Contact a health care provider if:  You have pain that gets worse.  Your incontinence gets worse. Get help right away if:  You have a fever or chills.  You are unable to urinate.  You have redness in your groin area or down your legs. Summary  Urinary incontinence refers to a condition in which a person is unable to control where and when to pass urine.  This condition may be caused by medicines, infection, weak bladder muscles, weak pelvic floor muscles, enlargement of the prostate (in men), or surgery.  The following factors increase your risk for developing this condition: older age, obesity, pregnancy and childbirth, menopause, neurological diseases, and chronic coughing.  There are several types of urinary incontinence. They include urge incontinence, stress incontinence, overflow incontinence, and functional incontinence.  This condition is  usually treated first with lifestyle and behavioral changes, such as quitting smoking, eating a healthier diet, and doing regular pelvic floor exercises. Other treatment options include medicines, bulking agents, medical devices, electrical nerve stimulation, or surgery. This information is not intended to replace advice given to you by your health care provider. Make sure you discuss any questions you have with your health care provider. Document Released: 06/19/2004 Document  Revised: 05/22/2017 Document Reviewed: 08/21/2016 Elsevier Patient Education  2020 [employer]

## 2019-04-04 NOTE — Progress Notes (Signed)
11/9/20203:48 PM  Diana Walsh 1966/05/05, 53 y.o., female [id number]  Chief Complaint  Patient presents with  . major bladder leak    feels like needs to have pad all the time. On going for years--more annoying lately(2-3 m)      HPI:   Patient is a 53 y.o. female who presents today for urinary incontinence  Having increased urinary incontinence With sneezing, coughing, lift anything, playing tennis Has been getting worse over the past months Denies any urge incontinence, fever, chills, dysuria, hematuria Has done keigels for years G2P2, SVD Has a friend who had a bladder sling and is supper happy with results She is wanting referral   Depression screen [facility] 2/9 04/04/2019 12/14/2017 11/20/2017  Decreased Interest 0 0 0  Down, Depressed, Hopeless 0 0 0  PHQ - 2 Score 0 0 0    Fall Risk  04/04/2019 12/14/2017 11/20/2017 10/20/2017 02/21/2017  Falls in the past year? 0 No No No No  Number falls in past yr: 0 - - - -  Injury with Fall? 0 - - - -  Follow up Falls evaluation completed - - - -     Allergies  Allergen Reactions  . Penicillins Nausea And Vomiting       . Codeine Palpitations    Prior to Admission medications   Medication Sig Start Date End Date Taking? Authorizing Provider  escitalopram (LEXAPRO) 20 MG tablet Take 1 tablet (20 mg total) by mouth daily. 12/14/17  Yes Myles Lipps, MD  fluticasone [facility]) 50 MCG/ACT nasal spray Place 2 sprays into both nostrils daily as needed for allergies or rhinitis. 10/02/15  Yes Zannie Cove, MD  levonorgestrel (MIRENA, 52 MG,) 20 MCG/24HR IUD 1 Intra Uterine Device (1 each total) by Intrauterine route once for 1 dose. Inserted today in clinic 12/14/17 04/04/19 Yes Myles Lipps, MD  loratadine (CLARITIN) 10 MG tablet Take 1 tablet (10 mg total) by mouth daily as needed for allergies. 10/02/15  Yes Zannie Cove, MD  Multiple Vitamin (MULTIVITAMIN) tablet Take 1 tablet by mouth daily. Reported on 09/05/2015   Yes  [provider]    Past Medical History:  Diagnosis Date  . Allergy   . Encounter for insertion of mirena IUD 12/14/2017  . Hyperlipidemia   . Kidney stone   . LGSIL (low grade squamous intraepithelial dysplasia) 1990   normal paps since    Past Surgical History:  Procedure Laterality Date  . BREAST SURGERY     Reduction  . COLPOSCOPY  1990  . EYE SURGERY     Bilateral  . INTRAUTERINE DEVICE INSERTION  12/14/2017   Mirena    Social History   Tobacco Use  . Smoking status: Never Smoker  . Smokeless tobacco: Never Used  Substance Use Topics  . Alcohol use: Yes    Comment: Rare    Family History  Problem Relation Age of Onset  . Heart disease Maternal Grandfather   . Heart disease Paternal Grandfather   . Hypertension Paternal Grandfather   . Hypertension Father   . Alzheimer's disease Maternal Grandmother     ROS Per hpi  OBJECTIVE:  Today's Vitals   04/04/19 1531 04/04/19 1534  BP: (!) 144/76 130/72  Pulse: 83   Temp: 99.2 F (37.3 C)   TempSrc: Oral   SpO2: 96%   Weight: 164 lb (74.4 kg)   Height: 5\' 3"  (1.6 m)    Body mass index is 29.05 kg/m.   Physical Exam Vitals signs  and nursing note reviewed.  Constitutional:      Appearance: She is well-developed.  HENT:     Head: Normocephalic and atraumatic.  Eyes:     General: No scleral icterus.    Conjunctiva/sclera: Conjunctivae normal.     Pupils: Pupils are equal, round, and reactive to light.  Neck:     Musculoskeletal: Neck supple.  Pulmonary:     Effort: Pulmonary effort is normal.  Skin:    General: Skin is warm and dry.  Neurological:     Mental Status: She is alert and oriented to person, place, and time.     Results for orders placed or performed in visit on 04/04/19 (from the past 24 hour(s))  POCT urinalysis dipstick     Status: Abnormal   Collection Time: 04/04/19  3:54 PM  Result Value Ref Range   Color, UA yellow yellow   Clarity, UA clear clear   Glucose,  UA negative negative mg/dL   Bilirubin, UA negative negative   Ketones, POC UA negative negative mg/dL   Spec Grav, UA 1.025 1.010 - 1.025   Blood, UA trace-intact (A) negative   pH, UA 7.0 5.0 - 8.0   Protein Ur, POC negative negative mg/dL   Urobilinogen, UA 0.2 0.2 or 1.0 E.U./dL   Nitrite, UA Negative Negative   Leukocytes, UA Trace (A) Negative    No results found.   ASSESSMENT and PLAN  1. Stress incontinence in female - POCT urinalysis dipstick - Ambulatory referral to Urology  2. Abnormal urinalysis - Urine Culture  Return if symptoms worsen or fail to improve.    Rutherford Guys, MD Primary Care at [place], [place] [address] Ph.  [phone] Fax [phone]

## 2019-04-05 LAB — URINE CULTURE: Organism ID, Bacteria: NO GROWTH

## 2019-08-18 ENCOUNTER — Ambulatory Visit: Payer: BC Managed Care – PPO | Admitting: Family Medicine

## 2019-08-18 ENCOUNTER — Other Ambulatory Visit: Payer: Self-pay

## 2019-08-18 ENCOUNTER — Encounter: Payer: Self-pay | Admitting: Family Medicine

## 2019-08-18 VITALS — BP 118/81 | HR 72 | Temp 98.2°F | Ht 63.0 in | Wt 165.0 lb

## 2019-08-18 DIAGNOSIS — H66001 Acute suppurative otitis media without spontaneous rupture of ear drum, right ear: Secondary | ICD-10-CM

## 2019-08-18 DIAGNOSIS — Z1239 Encounter for other screening for malignant neoplasm of breast: Secondary | ICD-10-CM | POA: Diagnosis not present

## 2019-08-18 DIAGNOSIS — Z1211 Encounter for screening for malignant neoplasm of colon: Secondary | ICD-10-CM

## 2019-08-18 DIAGNOSIS — F411 Generalized anxiety disorder: Secondary | ICD-10-CM | POA: Diagnosis not present

## 2019-08-18 MED ORDER — AZITHROMYCIN 250 MG PO TABS: ORAL_TABLET | ORAL | 0 refills | Status: AC

## 2019-08-18 MED ORDER — CITALOPRAM HYDROBROMIDE 10 MG PO TABS: 10.0000 mg | ORAL_TABLET | Freq: Every day | ORAL | 2 refills | Status: AC

## 2019-08-18 NOTE — Patient Instructions (Signed)
° ° ° °  If you have lab work done today you will be contacted with your lab results within the next 2 weeks.  If you have not heard from us then please contact us. The fastest way to get your results is to register for My Chart. ° ° °IF you received an x-ray today, you will receive an invoice from [facility]. Please contact  Radiology at [phone] with questions or concerns regarding your invoice.  ° °IF you received labwork today, you will receive an invoice from [facility]. Please contact [facility] at [phone] with questions or concerns regarding your invoice.  ° °Our billing staff will not be able to assist you with questions regarding bills from these companies. ° °You will be contacted with the lab results as soon as they are available. The fastest way to get your results is to activate your My Chart account. Instructions are located on the last page of this paperwork. If you have not heard from us regarding the results in 2 weeks, please contact this office. °  ° ° ° °

## 2019-08-18 NOTE — Progress Notes (Signed)
3/25/20219:39 AM  Diana Walsh 11/27/1965, 54 y.o., female [id number]  Chief Complaint  Patient presents with  . Anxiety    not sure how long it has been since she's off of the med, taking supplements for now. Lexapro made her very groggy. Looking to discuss another meds, anxiety gets worse around menstrual    HPI:   Patient is a 54 y.o. female with past medical history significant for GAD who presents today for anxiety  Last OV when anxiety discussed was in 2019 at which time lexapro was increased which she did not tolerate She decreased lexapro but that does not work She usually takes supplements to help with anxiety which usually work well, exercises and journals Last 6 months have been very stressful Her father committed suicide in aug 2020  Anxiety also worse around her cycle and coincides with headaches and spotting couple days later GAD 7 = 7   covid vaccines completed  Diana Walsh any fhx colon cancer  Last breast cancer screening a year ago thru mobile clinic at work, reports normal   Having right ear pain for past 2 days, woke her up from sleep Denies any nasal congestion, runny nose, sore throat, cough, SOB, fever or chills. Reports normal hearing Allergic to Diana Walsh  Depression screen [facility] 2/9 04/04/2019 12/14/2017 11/20/2017  Decreased Interest 0 0 0  Down, Depressed, Hopeless 0 0 0  PHQ - 2 Score 0 0 0    Fall Risk  08/18/2019 04/04/2019 12/14/2017 11/20/2017 10/20/2017  Falls in the past year? 0 0 No No No  Number falls in past yr: 0 0 - - -  Injury with Fall? 0 0 - - -  Follow up - Falls evaluation completed - - -     Allergies  Allergen Reactions  . Penicillins Nausea And Vomiting    Has patient had a PCN reaction causing immediate rash, facial/tongue/throat swelling, SOB or lightheadedness with hypotension: No Has patient had a PCN reaction causing severe rash involving mucus membranes or skin necrosis: No Has patient had a PCN reaction that required  hospitalization No Has patient had a PCN reaction occurring within the last 10 years: No If all of the above answers are "NO", then may proceed with Cephalosporin use.   . Codeine Palpitations    Prior to Admission medications   Medication Sig Start Date End Date Taking? Authorizing Provider  fluticasone (FLONASE) 50 MCG/ACT nasal spray Place 2 sprays into both nostrils daily as needed for allergies or rhinitis. 10/02/15  Yes Domenic Polite, MD  loratadine (CLARITIN) 10 MG tablet Take 1 tablet (10 mg total) by mouth daily as needed for allergies. 10/02/15  Yes Domenic Polite, MD  Multiple Vitamin (MULTIVITAMIN) tablet Take 1 tablet by mouth daily. Reported on 09/05/2015   Yes [provider]  levonorgestrel (MIRENA, 52 MG,) 20 MCG/24HR IUD 1 Intra Uterine Device (1 each total) by Intrauterine route once for 1 dose. Inserted today in clinic 12/14/17 04/04/19  Rutherford Guys, MD    Past Medical History:  Diagnosis Date  . Allergy   . Encounter for insertion of mirena IUD 12/14/2017  . Hyperlipidemia   . Kidney stone   . LGSIL (low grade squamous intraepithelial dysplasia) 1990   normal paps since    Past Surgical History:  Procedure Laterality Date  . BREAST SURGERY     Reduction  . COLPOSCOPY  1990  . EYE SURGERY     Bilateral  . INTRAUTERINE DEVICE INSERTION  12/14/2017  Mirena    Social History   Tobacco Use  . Smoking status: Never Smoker  . Smokeless tobacco: Never Used  Substance Use Topics  . Alcohol use: Yes    Comment: Rare    Family History  Problem Relation Age of Onset  . Heart disease Maternal Grandfather   . Heart disease Paternal Grandfather   . Hypertension Paternal Grandfather   . Hypertension Father   . Alzheimer's disease Maternal Grandmother     ROS Per hpi  OBJECTIVE:  Today's Vitals   08/18/19 0911  BP: 118/81  Pulse: 72  Temp: 98.2 F (36.8 C)  SpO2: 97%  Weight: 165 lb (74.8 kg)  Height: 5\' 3"  (1.6 m)   Body mass index  is 29.23 kg/m.   Physical Exam Vitals and nursing note reviewed.  Constitutional:      Appearance: She is well-developed.  HENT:     Head: Normocephalic and atraumatic.     Right Ear: Hearing, ear canal and external ear normal. Tympanic membrane is erythematous and bulging. Tympanic membrane is not perforated.     Left Ear: Hearing, tympanic membrane, ear canal and external ear normal.  Eyes:     Extraocular Movements: Extraocular movements intact.     Conjunctiva/sclera: Conjunctivae normal.     Pupils: Pupils are equal, round, and reactive to light.  Cardiovascular:     Rate and Rhythm: Normal rate and regular rhythm.     Heart sounds: Normal heart sounds. No murmur. No friction rub. No gallop.   Pulmonary:     Effort: Pulmonary effort is normal.     Breath sounds: Normal breath sounds. No wheezing or rales.  Musculoskeletal:     Cervical back: Neck supple.  Lymphadenopathy:     Cervical: No cervical adenopathy.  Skin:    General: Skin is warm and dry.  Neurological:     Mental Status: She is alert and oriented to person, place, and time.     No results found for this or any previous visit (from the past 24 hour(s)).  No results found.   ASSESSMENT and PLAN  1. Generalized anxiety disorder Not controlled. Would like to try use of celexa during luteal phase despite having IUD. Discussed she would benefit most with daily use. Discussed SSRI and st Diana worts.  2. Special screening for malignant neoplasms, colon - Cologuard  3. Encounter for breast cancer screening other than mammogram - MM Digital Screening; Future  4. Non-recurrent acute suppurative otitis media of right ear without spontaneous rupture of tympanic membrane rx for azithromycin given PNC allergy.  Other orders - citalopram (CELEXA) 10 MG tablet; Take 1 tablet (10 mg total) by mouth daily. - azithromycin (ZITHROMAX) 250 MG tablet; Take 2 tablets (500 mg total) by mouth daily for 1 day, THEN 1  tablet (250 mg total) daily for 4 days.  Return in about 3 months (around 11/18/2019).    11/20/2019, MD Primary Care at [facility] [address] [place], [place] Ph.  [phone] Fax [phone]

## 2019-08-29 ENCOUNTER — Ambulatory Visit
Admission: RE | Admit: 2019-08-29 | Discharge: 2019-08-29 | Disposition: A | Discharge status: Home / Self Care | Payer: BC Managed Care – PPO | Source: Ambulatory Visit | Attending: Family Medicine | Admitting: Family Medicine

## 2019-08-29 ENCOUNTER — Other Ambulatory Visit: Payer: Self-pay

## 2019-08-29 DIAGNOSIS — Z1239 Encounter for other screening for malignant neoplasm of breast: Secondary | ICD-10-CM

## 2019-10-21 LAB — COLOGUARD: Cologuard: NEGATIVE

## 2019-10-21 NOTE — Progress Notes (Unsigned)
Called pt and left VM to call back to give lab results.

## 2019-11-07 ENCOUNTER — Telehealth (INDEPENDENT_AMBULATORY_CARE_PROVIDER_SITE_OTHER): Payer: BC Managed Care – PPO | Admitting: Family Medicine

## 2019-11-07 ENCOUNTER — Encounter: Payer: Self-pay | Admitting: Family Medicine

## 2019-11-07 ENCOUNTER — Other Ambulatory Visit: Payer: Self-pay

## 2019-11-07 VITALS — BP 132/83 | HR 77 | Temp 98.2°F | Ht 63.0 in | Wt 167.2 lb

## 2019-11-07 DIAGNOSIS — F411 Generalized anxiety disorder: Secondary | ICD-10-CM

## 2019-11-07 DIAGNOSIS — L821 Other seborrheic keratosis: Secondary | ICD-10-CM

## 2019-11-07 NOTE — Progress Notes (Signed)
6/14/20215:46 PM  Diana Walsh 11/05/1965, 54 y.o., female [id number]  Chief Complaint  Patient presents with  . brown spots    has spots on the hand and chest that are concerning    HPI:   Patient is a 53 y.o. female who presents today with concerns for brown spots  Patient comes in today for a new lesion on left upper chest that she noticed a week ago and a small lesion that seems to be growing on right hand Worried since her husband had melanoma and her mother has had multiple non-melanoma skin cancers She denies any itchiness, drainage, pain of these lesions  She also states that she is doing well on celexa which she does not take daily  She also had a dog tick removed today from her back She is very clear on that it was not a deer tick   Depression screen [facility] 2/9 04/04/2019 12/14/2017 11/20/2017  Decreased Interest 0 0 0  Down, Depressed, Hopeless 0 0 0  PHQ - 2 Score 0 0 0    Fall Risk  11/07/2019 08/18/2019 04/04/2019 12/14/2017 11/20/2017  Falls in the past year? 0 0 0 No No  Number falls in past yr: 0 0 0 - -  Injury with Fall? 0 0 0 - -  Follow up - - Falls evaluation completed - -     Allergies  Allergen Reactions  . Penicillins Nausea And Vomiting    Has patient had a PCN reaction causing immediate rash, facial/tongue/throat swelling, SOB or lightheadedness with hypotension: No Has patient had a PCN reaction causing severe rash involving mucus membranes or skin necrosis: No Has patient had a PCN reaction that required hospitalization No Has patient had a PCN reaction occurring within the last 10 years: No If all of the above answers are "NO", then may proceed with Cephalosporin use.   . Codeine Palpitations    Prior to Admission medications   Medication Sig Start Date End Date Taking? Authorizing Provider  citalopram (CELEXA) 10 MG tablet Take 1 tablet (10 mg total) by mouth daily. 08/18/19  Yes Myles Lipps, MD  fluticasone [facility]) 50 MCG/ACT  nasal spray Place 2 sprays into both nostrils daily as needed for allergies or rhinitis. 10/02/15  Yes Zannie Cove, MD  levonorgestrel (MIRENA, 52 MG,) 20 MCG/24HR IUD 1 Intra Uterine Device (1 each total) by Intrauterine route once for 1 dose. Inserted today in clinic 12/14/17 11/07/19 Yes Myles Lipps, MD  loratadine (CLARITIN) 10 MG tablet Take 1 tablet (10 mg total) by mouth daily as needed for allergies. 10/02/15  Yes Zannie Cove, MD  Multiple Vitamin (MULTIVITAMIN) tablet Take 1 tablet by mouth daily. Reported on 09/05/2015   Yes [provider]    Past Medical History:  Diagnosis Date  . Allergy   . Encounter for insertion of mirena IUD 12/14/2017  . Hyperlipidemia   . Kidney stone   . LGSIL (low grade squamous intraepithelial dysplasia) 1990   normal paps since    Past Surgical History:  Procedure Laterality Date  . BREAST SURGERY     Reduction  . COLPOSCOPY  1990  . EYE SURGERY     Bilateral  . INTRAUTERINE DEVICE INSERTION  12/14/2017   Mirena    Social History   Tobacco Use  . Smoking status: Never Smoker  . Smokeless tobacco: Never Used  Substance Use Topics  . Alcohol use: Yes    Comment: Rare    Family History  Problem  Relation Age of Onset  . Heart disease Maternal Grandfather   . Heart disease Paternal Grandfather   . Hypertension Paternal Grandfather   . Hypertension Father   . Alzheimer's disease Maternal Grandmother     ROS Per hpi  OBJECTIVE:  Today's Vitals   11/07/19 1700  BP: 132/83  Pulse: 77  Temp: 98.2 F (36.8 C)  SpO2: 97%  Weight: 167 lb 3.2 oz (75.8 kg)  Height: 5\' 3"  (1.6 m)   Body mass index is 29.62 kg/m.   Physical Exam Vitals and nursing note reviewed.  Constitutional:      Appearance: She is well-developed.  HENT:     Head: Normocephalic and atraumatic.  Eyes:     General: No scleral icterus.    Conjunctiva/sclera: Conjunctivae normal.     Pupils: Pupils are equal, round, and reactive to light.   Pulmonary:     Effort: Pulmonary effort is normal.  Musculoskeletal:     Cervical back: Neck supple.  Skin:    General: Skin is warm and dry.     Comments: #1 upper left chest ~ 1cm tan colored discrete papule  #2 right dorsum hand near base of thumb ~ 50mm tan colored discrete papule  Neurological:     Mental Status: She is alert and oriented to person, place, and time.     No results found for this or any previous visit (from the past 24 hour(s)).  No results found.   ASSESSMENT and PLAN  1. Seborrheic keratosis Reassured. RTC precautions re skin lesions reviewed  2. Generalized anxiety disorder Stable. Cont current regime  Return if symptoms worsen or fail to improve.    Rutherford Guys, MD Primary Care at [place], [place] [address] Ph.  [phone] Fax [phone]

## 2019-11-07 NOTE — Patient Instructions (Signed)
° ° ° °  If you have lab work done today you will be contacted with your lab results within the next 2 weeks.  If you have not heard from us then please contact us. The fastest way to get your results is to register for My Chart. ° ° °IF you received an x-ray today, you will receive an invoice from [facility]. Please contact [facility] at [phone] with questions or concerns regarding your invoice.  ° °IF you received labwork today, you will receive an invoice from [facility]. Please contact [facility] at [phone] with questions or concerns regarding your invoice.  ° °Our billing staff will not be able to assist you with questions regarding bills from these companies. ° °You will be contacted with the lab results as soon as they are available. The fastest way to get your results is to activate your My Chart account. Instructions are located on the last page of this paperwork. If you have not heard from us regarding the results in 2 weeks, please contact this office. °  ° ° ° °

## 2021-03-05 ENCOUNTER — Ambulatory Visit: Payer: BC Managed Care – PPO | Admitting: Sports Medicine

## 2021-03-19 ENCOUNTER — Ambulatory Visit: Payer: Self-pay | Admitting: Sports Medicine

## 2021-04-02 ENCOUNTER — Ambulatory Visit (INDEPENDENT_AMBULATORY_CARE_PROVIDER_SITE_OTHER): Payer: BC Managed Care – PPO | Admitting: Sports Medicine

## 2021-04-02 ENCOUNTER — Ambulatory Visit
Admission: RE | Admit: 2021-04-02 | Discharge: 2021-04-02 | Disposition: A | Discharge status: Home / Self Care | Payer: Self-pay | Source: Ambulatory Visit | Attending: Sports Medicine | Admitting: Sports Medicine

## 2021-04-02 VITALS — BP 124/80 | Ht 63.0 in | Wt 145.0 lb

## 2021-04-02 DIAGNOSIS — M25522 Pain in left elbow: Secondary | ICD-10-CM | POA: Diagnosis not present

## 2021-04-02 DIAGNOSIS — G5622 Lesion of ulnar nerve, left upper limb: Secondary | ICD-10-CM | POA: Diagnosis not present

## 2021-04-02 MED ORDER — MELOXICAM 15 MG PO TABS: ORAL_TABLET | ORAL | 0 refills | Status: DC

## 2021-04-02 NOTE — Patient Instructions (Signed)
We saw you today for left elbow pain.  We performed an ultrasound which showed some evidence of a possible bone spur at the side of your elbow in the region where your pain occurs.  We are going start a medication that is an anti-inflammatory called meloxicam which we would like for you to use for the next 10 days.  We also going to get an x-ray of that left elbow.  We will let you know the results of the x-ray and if you are still having pain in 3 weeks would like for you to follow-up.  The anti-inflammatory medication will also help the numbness and tingling of the fourth and fifth finger on that hand.  I do recommend that you try to pad that elbow when driving or riding long distances.

## 2021-04-02 NOTE — Progress Notes (Addendum)
SUBJECTIVE:   CHIEF COMPLAINT / HPI:   Left elbow pain: 55 year old female presenting with left elbow pain.  Patient states that over the past several months she has had a worsening of pain at the lateral aspect of her left elbow.  She states she is right-handed and plays tennis regularly and initially had someone tell her they thought it was tennis elbow but she has had this in the past and thinks this is something different.  She states that the pain is not really improved even with trying some Voltaren gel and ibuprofen and so she ultimately decided to come in to be evaluated.  Denies any trauma to the area.    Numbness/tingling fourth and fifth digit of the left hand: States that she lately has noticed some numbness and tingling in her fourth and fifth digit particularly if she has the elbow resting for long peers of time.  She noticed this has been worsening after spending more time on the road as her sons compete competitively in racecar driving and she often rests her elbow on the elbow rest when driving or riding long distances.  She is also noticed this sometimes being exacerbated at night while resting her elbow on the side of the mattress.   OBJECTIVE:   BP 124/80   Ht 5\' 3"  (1.6 m)   Wt 145 lb (65.8 kg)   BMI 25.69 kg/m    General: NAD, pleasant, able to participate in exam MSK: Left elbow: No erythema, swelling, or visual defect present.  No pain with palpation of the origin of the wrist extensors.  No pain with palpation at the distal aspect of the triceps.  Patient does have pain with palpation at the lateral aspect of the olecranon.  Strength 5/5 with elbow flexion and extension, 5/5 with wrist flexion, extension, pronation, supination.  Fine touch sensation intact.  No pain with palpation of the region of the cubital tunnel.  Patient does have positive Tinel's sign at the elbow.  Limited ultrasound: Left elbow: No evidence of effusion or obvious tear at the distal  insertion of the triceps.  No obvious effusion or ligamentous tear present at the joint space.  There is a hyperechoic structure present on the radial side of the olecranon suggestive of a bony spur which is in the region of the patient's discomfort.  This was not viewed when evaluating the patient's right elbow for comparison.  Findings suggestive of possible small bony spur.  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  Positive Tinel's at the elbow on physical exam today which reproduced her symptoms.  We will do meloxicam for the next 10 days and will recommend an elbow rest/padding to minimize further exacerbation.  Left elbow pain: Present on the radial side of the olecranon.  No radiation of the pain.  Elbow flexion/extension and wrist strength in extension/flexion/pronation/supination are all appropriate.  Her symptoms are not consistent with lateral epicondylitis with no pain at the proximal insertion of the wrist extensors.  Ultrasound was performed which did show signs suggestive of a small bony spur in the region of the patient's discomfort which was not present on imaging of the contralateral side.  We will get an x-ray of the left elbow to evaluate for any pathology that can explain the patient's discomfort.  We will do meloxicam for the next 10 days.  We will have patient follow-up in 3 weeks if symptoms continue.  At that point we may consider an MRI for further imaging evaluation.   Jackelyn Poling, DO [facility]   Patient seen and evaluated with the resident.  I agree with the above plan of care.  Review of the x-rays show that they are unremarkable.  The small spur seen on ultrasound is not seen on x-ray.  Proceed with treatment as above.  Tentative follow-up with me in 3 weeks.  If symptoms persist in the lateral elbow despite today's treatment then I may consider merits of further diagnostic  imaging.  Call with questions or concerns in the interim.

## 2021-04-23 ENCOUNTER — Ambulatory Visit: Payer: BC Managed Care – PPO | Admitting: Sports Medicine

## 2023-03-17 ENCOUNTER — Ambulatory Visit: Payer: BC Managed Care – PPO | Admitting: Internal Medicine

## 2023-03-17 ENCOUNTER — Encounter: Payer: Self-pay | Admitting: Internal Medicine

## 2023-03-17 VITALS — BP 110/80 | HR 75 | Temp 98.6°F | Ht 62.5 in | Wt 167.4 lb

## 2023-03-17 DIAGNOSIS — Z124 Encounter for screening for malignant neoplasm of cervix: Secondary | ICD-10-CM

## 2023-03-17 DIAGNOSIS — Z13220 Encounter for screening for lipoid disorders: Secondary | ICD-10-CM | POA: Diagnosis not present

## 2023-03-17 DIAGNOSIS — Z23 Encounter for immunization: Secondary | ICD-10-CM | POA: Diagnosis not present

## 2023-03-17 DIAGNOSIS — Z1231 Encounter for screening mammogram for malignant neoplasm of breast: Secondary | ICD-10-CM

## 2023-03-17 DIAGNOSIS — Z1211 Encounter for screening for malignant neoplasm of colon: Secondary | ICD-10-CM

## 2023-03-17 DIAGNOSIS — Z1159 Encounter for screening for other viral diseases: Secondary | ICD-10-CM | POA: Diagnosis not present

## 2023-03-17 DIAGNOSIS — Z114 Encounter for screening for human immunodeficiency virus [HIV]: Secondary | ICD-10-CM

## 2023-03-17 DIAGNOSIS — Z0000 Encounter for general adult medical examination without abnormal findings: Secondary | ICD-10-CM | POA: Diagnosis not present

## 2023-03-17 NOTE — Progress Notes (Unsigned)
New Patient Office Visit     CC/Reason for Visit: Establish care, annual preventive exam Previous PCP: Koren Shiver, MD Last Visit: Unknown  HPI: Jamira Selner is a 57 y.o. female who is coming in today for the above mentioned reasons. Past Medical History is significant for: Generalized anxiety disorder on low-dose Celexa.  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  She is now due for screening colonoscopy mammogram and Pap smear.  She is due for flu and shingles vaccinations.  Is feeling well without major health concerns.   Past Medical/Surgical History: Past Medical History:  Diagnosis Date   Allergy    Encounter for insertion of Mirena IUD 12/14/2017   Hyperlipidemia    Kidney stone    LGSIL (low grade squamous intraepithelial dysplasia) 1990   normal paps since    Past Surgical History:  Procedure Laterality Date   BREAST SURGERY     Reduction   COLPOSCOPY  1990   EYE SURGERY     Bilateral   INTRAUTERINE DEVICE INSERTION  12/14/2017   Mirena    Social History:  reports that she has never smoked. She has never used smokeless tobacco. She reports current alcohol use. She reports that she does not use drugs.  Allergies: Allergies  Allergen Reactions   Penicillins Nausea And Vomiting    Has patient had a PCN reaction causing immediate rash, facial/tongue/throat swelling, SOB or lightheadedness with hypotension: No Has patient had a PCN reaction causing severe rash involving mucus membranes or skin necrosis: No Has patient had a PCN reaction that required hospitalization No Has patient had a PCN reaction occurring within the last 10 years: No If all of the above answers are "NO", then may proceed with Cephalosporin use.    Codeine Palpitations    Family History:  Family History  Problem Relation Age of  Onset   Heart disease Maternal Grandfather    Heart disease Paternal Grandfather    Hypertension Paternal Grandfather    Hypertension Father    Alzheimer's disease Maternal Grandmother      Current Outpatient Medications:    citalopram (CELEXA) 10 MG tablet, Take 1 tablet (10 mg total) by mouth daily., Disp: 30 tablet, Rfl: 2   fluticasone (FLONASE) 50 MCG/ACT nasal spray, Place 2 sprays into both nostrils daily as needed for allergies or rhinitis., Disp: , Rfl:    levonorgestrel (MIRENA, 52 MG,) 20 MCG/24HR IUD, 1 Intra Uterine Device (1 each total) by Intrauterine route once for 1 dose. Inserted today in clinic, Disp: 1 each, Rfl: 0   loratadine (CLARITIN) 10 MG tablet, Take 1 tablet (10 mg total) by mouth daily as needed for allergies., Disp: 30 tablet, Rfl: 0   Multiple Vitamin (MULTIVITAMIN) tablet, Take 1 tablet by mouth daily. Reported on 09/05/2015, Disp: , Rfl:   Review of Systems:  Negative except as indicated in HPI.   Physical Exam: Vitals:   03/17/23 1525  BP: 110/80  Pulse: 75  Temp: 98.6 F (37 C)  TempSrc: Oral  SpO2: 98%  Weight: 167 lb 6.4 oz (75.9 kg)  Height: 5' 2.5" (1.588 m)   Body mass index is 30.13 kg/m.  Physical Exam Vitals reviewed.  Constitutional:      General: She is not in acute distress.    Appearance: Normal appearance. She is not ill-appearing, toxic-appearing or diaphoretic.  HENT:     Head: Normocephalic.     Right Ear: Tympanic membrane, ear canal and external ear normal. There is no impacted cerumen.     Left Ear: Tympanic membrane, ear canal and external ear normal. There is no impacted cerumen.     Nose: Nose normal.     Mouth/Throat:     Mouth: Mucous membranes are moist.     Pharynx: Oropharynx is clear. No oropharyngeal exudate or posterior oropharyngeal erythema.  Eyes:     General: No scleral icterus.       Right eye: No discharge.        Left eye: No discharge.     Conjunctiva/sclera: Conjunctivae normal.     Pupils:  Pupils are equal, round, and reactive to light.  Neck:     Vascular: No carotid bruit.  Cardiovascular:     Rate and Rhythm: Normal rate and regular rhythm.     Pulses: Normal pulses.     Heart sounds: Normal heart sounds.  Pulmonary:     Effort: Pulmonary effort is normal. No respiratory distress.     Breath sounds: Normal breath sounds.  Abdominal:     General: Abdomen is flat. Bowel sounds are normal.     Palpations: Abdomen is soft.  Musculoskeletal:        General: Normal range of motion.     Cervical back: Normal range of motion.  Skin:    General: Skin is warm and dry.  Neurological:     General: No focal deficit present.     Mental Status: She is alert and oriented to person, place, and time. Mental status is at baseline.  Psychiatric:        Mood and Affect: Mood normal.        Behavior: Behavior normal.        Thought Content: Thought content normal.        Judgment: Judgment normal.     Flowsheet Row Office Visit from 03/17/2023 in [facility] [place]  PHQ-9 Total Score 0        Impression and Plan:  Encounter for preventive health examination -     CBC with Differential/Platelet; Future -     Comprehensive metabolic panel; Future -     Lipid panel; Future -     VITAMIN D 25 Hydroxy (Vit-D Deficiency, Fractures); Future -     Vitamin B12; Future -     TSH; Future  Encounter for screening mammogram for malignant neoplasm of breast -     Digital Screening Mammogram, Left and Right; Future  Screening for colon cancer -     Ambulatory referral to Gastroenterology  Screening for cervical cancer -     Ambulatory referral to Gynecology  Encounter for hepatitis C screening test for low risk patient -     Hepatitis C antibody; Future  Encounter for screening for HIV -     HIV Antibody (routine testing w rflx); Future   -Recommend routine eye and dental care. -Healthy lifestyle discussed in detail. -Labs to be updated  today. -Prostate cancer screening: N/A Health Maintenance  Topic Date Due   HIV Screening  Never done   Hepatitis C Screening  Never done   Colon Cancer Screening  Never done   Pap with HPV screening  10/20/2020   Mammogram  08/28/2021   COVID-19 Vaccine (1 - 2023-24 season) Never done   Zoster (Shingles) Vaccine (2 of 2) 05/12/2023   DTaP/Tdap/Td vaccine (2 -  Tdap) 10/21/2027   Flu Shot  Completed   HPV Vaccine  Aged Out       -Flu and shingles vaccines administered in office today. -Referrals to GI, GYN and for mammogram placed.    Chaya Jan, MD [facility]

## 2023-03-18 LAB — CBC WITH DIFFERENTIAL/PLATELET
Basophils Absolute: 0.1 10*3/uL (ref 0.0–0.1)
Basophils Relative: 1 % (ref 0.0–3.0)
Eosinophils Absolute: 0.2 10*3/uL (ref 0.0–0.7)
Eosinophils Relative: 2.5 % (ref 0.0–5.0)
HCT: 42.6 % (ref 36.0–46.0)
Hemoglobin: 13.9 g/dL (ref 12.0–15.0)
Lymphocytes Relative: 29.1 % (ref 12.0–46.0)
Lymphs Abs: 2.3 10*3/uL (ref 0.7–4.0)
MCHC: 32.6 g/dL (ref 30.0–36.0)
MCV: 95.4 fL (ref 78.0–100.0)
Monocytes Absolute: 0.5 10*3/uL (ref 0.1–1.0)
Monocytes Relative: 6.4 % (ref 3.0–12.0)
Neutro Abs: 4.8 10*3/uL (ref 1.4–7.7)
Neutrophils Relative %: 61 % (ref 43.0–77.0)
Platelets: 419 10*3/uL — ABNORMAL HIGH (ref 150.0–400.0)
RBC: 4.46 Mil/uL (ref 3.87–5.11)
RDW: 12.7 % (ref 11.5–15.5)
WBC: 7.9 10*3/uL (ref 4.0–10.5)

## 2023-03-18 LAB — COMPREHENSIVE METABOLIC PANEL
ALT: 13 U/L (ref 0–35)
AST: 13 U/L (ref 0–37)
Albumin: 4.5 g/dL (ref 3.5–5.2)
Alkaline Phosphatase: 88 U/L (ref 39–117)
BUN: 12 mg/dL (ref 6–23)
CO2: 29 meq/L (ref 19–32)
Calcium: 9.7 mg/dL (ref 8.4–10.5)
Chloride: 104 meq/L (ref 96–112)
Creatinine, Ser: 0.7 mg/dL (ref 0.40–1.20)
GFR: 97.48 mL/min (ref >60.00)
Glucose, Bld: 85 mg/dL (ref 70–99)
Potassium: 4.6 meq/L (ref 3.5–5.1)
Sodium: 138 meq/L (ref 135–145)
Total Bilirubin: 0.3 mg/dL (ref 0.2–1.2)
Total Protein: 7.1 g/dL (ref 6.0–8.3)

## 2023-03-18 LAB — LIPID PANEL
Cholesterol: 279 mg/dL — ABNORMAL HIGH (ref 0–200)
HDL: 56.7 mg/dL (ref >39.00)
LDL Cholesterol: 162 mg/dL — ABNORMAL HIGH (ref 0–99)
NonHDL: 222.46
Total CHOL/HDL Ratio: 5
Triglycerides: 303 mg/dL — ABNORMAL HIGH (ref 0.0–149.0)
VLDL: 60.6 mg/dL — ABNORMAL HIGH (ref 0.0–40.0)

## 2023-03-18 LAB — VITAMIN D 25 HYDROXY (VIT D DEFICIENCY, FRACTURES): VITD: 19.52 ng/mL — ABNORMAL LOW (ref 30.00–100.00)

## 2023-03-18 LAB — HIV ANTIBODY (ROUTINE TESTING W REFLEX): HIV 1&2 Ab, 4th Generation: NONREACTIVE

## 2023-03-18 LAB — TSH: TSH: 1.97 u[IU]/mL (ref 0.35–5.50)

## 2023-03-18 LAB — VITAMIN B12: Vitamin B-12: 304 pg/mL (ref 211–911)

## 2023-03-18 LAB — HEPATITIS C ANTIBODY: Hepatitis C Ab: NONREACTIVE

## 2023-03-19 ENCOUNTER — Encounter: Payer: Self-pay | Admitting: Internal Medicine

## 2023-03-19 ENCOUNTER — Other Ambulatory Visit: Payer: Self-pay | Admitting: Internal Medicine

## 2023-03-19 DIAGNOSIS — E559 Vitamin D deficiency, unspecified: Secondary | ICD-10-CM

## 2023-03-19 DIAGNOSIS — E785 Hyperlipidemia, unspecified: Secondary | ICD-10-CM

## 2023-03-19 MED ORDER — VITAMIN D (ERGOCALCIFEROL) 1.25 MG (50000 UNIT) PO CAPS: 50000.0000 [IU] | ORAL_CAPSULE | ORAL | 0 refills | Status: AC

## 2023-03-19 MED ORDER — ROSUVASTATIN CALCIUM 5 MG PO TABS: 5.0000 mg | ORAL_TABLET | Freq: Every day | ORAL | 1 refills | Status: DC

## 2023-04-02 ENCOUNTER — Ambulatory Visit: Payer: BC Managed Care – PPO | Admitting: *Deleted

## 2023-04-02 VITALS — Ht 62.5 in | Wt 165.0 lb

## 2023-04-02 DIAGNOSIS — Z1211 Encounter for screening for malignant neoplasm of colon: Secondary | ICD-10-CM

## 2023-04-02 MED ORDER — NA SULFATE-K SULFATE-MG SULF 17.5-3.13-1.6 GM/177ML PO SOLN: 1.0000 | Freq: Once | ORAL | 0 refills | Status: AC

## 2023-04-02 NOTE — Progress Notes (Signed)
Pt's name and DOB verified at the beginning of the pre-visit wit 2 identifiers  Pt denies any difficulty with ambulating,sitting, laying down or rolling side to side  Pt has no issues moving head neck or swallowing  No egg or soy allergy known to patient   No issues known to pt with past sedation with any surgeries or procedures  Pt denies having issues being intubated  No FH of Malignant Hyperthermia  Pt is not on diet pills or shots  Pt is not on home 02   Pt is not on blood thinners   Pt denies issues with constipation   Pt is not on dialysis  Pt denise any abnormal heart rhythms   Pt denies any upcoming cardiac testing  Pt encouraged to use to use Singlecare or Goodrx to reduce cost   Patient's chart reviewed by Cathlyn Parsons CNRA prior to pre-visit and patient appropriate for the [facility].  Pre-visit completed and red dot placed by patient's name on their procedure day (on provider's schedule).  .  Visit by phone  Pt states weight is 165 lb  Instructed pt why it is important to and  to call if they have any changes in health or new medications. Directed them to the # given and on instructions.     Instructions reviewed. Pt given both [facility] main # and MD on call # prior to instructions.  Pt states understanding. Instructed to review again prior to procedure. Pt states they will.   Instructions sent by mail with coupon and by My Chart  Instructions and coupon given to pt   Instructions sent through My Chart  Coupon sent via text to mobile phone and pt verified they received it

## 2023-04-10 ENCOUNTER — Encounter: Payer: Self-pay | Admitting: Internal Medicine

## 2023-04-14 ENCOUNTER — Ambulatory Visit: Payer: BC Managed Care – PPO

## 2023-04-24 LAB — HM PAP SMEAR: HPV, high-risk: NEGATIVE

## 2023-04-28 NOTE — Progress Notes (Unsigned)
[facility] History and Physical   Primary Care Physician:  Philip Aspen, Limmie Patricia, MD   Reason for Procedure:   CRCA screening  Plan:    colonoscopy     HPI: Diana Walsh is a 57 y.o. female presenting for a screening exam with colonoscopy today.   Past Medical History:  Diagnosis Date   Encounter for insertion of Mirena IUD 12/14/2017   Hyperlipidemia    Kidney stone    LGSIL (low grade squamous intraepithelial dysplasia) 1990   normal paps since    Past Surgical History:  Procedure Laterality Date   BREAST SURGERY     Reduction   COLPOSCOPY  1990   EYE SURGERY     Bilateral   INTRAUTERINE DEVICE INSERTION  12/14/2017   Mirena    Prior to Admission medications   Medication Sig Start Date End Date Taking? Authorizing Provider  citalopram (CELEXA) 10 MG tablet Take 1 tablet (10 mg total) by mouth daily. 08/18/19   Lezlie Lye, Meda Coffee, MD  fluticasone [facility]) 50 MCG/ACT nasal spray Place 2 sprays into both nostrils daily as needed for allergies or rhinitis. 10/02/15   Zannie Cove, MD  levonorgestrel (MIRENA, 52 MG,) 20 MCG/24HR IUD 1 Intra Uterine Device (1 each total) by Intrauterine route once for 1 dose. Inserted today in clinic 12/14/17 03/17/23  Lezlie Lye, Meda Coffee, MD  loratadine (CLARITIN) 10 MG tablet Take 1 tablet (10 mg total) by mouth daily as needed for allergies. 10/02/15   Zannie Cove, MD  Multiple Vitamin (MULTIVITAMIN) tablet Take 1 tablet by mouth daily. Reported on 09/05/2015    [provider]  rosuvastatin (CRESTOR) 5 MG tablet Take 1 tablet (5 mg total) by mouth daily. 03/19/23   Philip Aspen, Limmie Patricia, MD  Vitamin D, Ergocalciferol, (DRISDOL) 1.25 MG (50000 UNIT) CAPS capsule Take 1 capsule (50,000 Units total) by mouth every 7 (seven) days for 12 doses. 03/19/23 06/05/23  Henderson Cloud, MD    Current Outpatient Medications  Medication Sig Dispense Refill   citalopram (CELEXA) 10 MG tablet Take  1 tablet (10 mg total) by mouth daily. 30 tablet 2   fluticasone (FLONASE) 50 MCG/ACT nasal spray Place 2 sprays into both nostrils daily as needed for allergies or rhinitis.     levonorgestrel (MIRENA, 52 MG,) 20 MCG/24HR IUD 1 Intra Uterine Device (1 each total) by Intrauterine route once for 1 dose. Inserted today in clinic 1 each 0   loratadine (CLARITIN) 10 MG tablet Take 1 tablet (10 mg total) by mouth daily as needed for allergies. 30 tablet 0   Multiple Vitamin (MULTIVITAMIN) tablet Take 1 tablet by mouth daily. Reported on 09/05/2015     rosuvastatin (CRESTOR) 5 MG tablet Take 1 tablet (5 mg total) by mouth daily. 90 tablet 1   Vitamin D, Ergocalciferol, (DRISDOL) 1.25 MG (50000 UNIT) CAPS capsule Take 1 capsule (50,000 Units total) by mouth every 7 (seven) days for 12 doses. 12 capsule 0   No current facility-administered medications for this visit.    Allergies as of 04/29/2023 - Review Complete 04/02/2023  Allergen Reaction Noted   Penicillins Nausea And Vomiting 12/11/2011   Codeine Palpitations 12/11/2011    Family History  Problem Relation Age of Onset   Hypertension Father    Alzheimer's disease Maternal Grandmother    Heart disease Maternal Grandfather    Heart disease Paternal Grandfather    Hypertension Paternal Grandfather    Colon cancer Neg Hx    Esophageal cancer  Neg Hx    Colon polyps Neg Hx    Rectal cancer Neg Hx    Stomach cancer Neg Hx     Social History   Socioeconomic History   Marital status: Married    Spouse name: Not on file   Number of children: Not on file   Years of education: Not on file   Highest education level: Not on file  Occupational History   Not on file  Tobacco Use   Smoking status: Never   Smokeless tobacco: Never  Substance and Sexual Activity   Alcohol use: Yes    Comment: Rare   Drug use: No   Sexual activity: Yes    Birth control/protection: I.U.D.    Comment: MIRENA inserted 05-16-10  Other Topics Concern   Not on  file  Social History Narrative   Not on file   Social Determinants of Health   Financial Resource Strain: Not on file  Food Insecurity: Not on file  Transportation Needs: Not on file  Physical Activity: Not on file  Stress: Not on file  Social Connections: Not on file  Intimate Partner Violence: Not on file    Review of Systems: Positive for *** All other review of systems negative except as mentioned in the HPI.  Physical Exam: Vital signs There were no vitals taken for this visit.  General:   Alert,  Well-developed, well-nourished, pleasant and cooperative in NAD Lungs:  Clear throughout to auscultation.   Heart:  Regular rate and rhythm; no murmurs, clicks, rubs,  or gallops. Abdomen:  Soft, nontender and nondistended. Normal bowel sounds.   Neuro/Psych:  Alert and cooperative. Normal mood and affect. A and O x 3   @Jayion Schneck  Sena Slate, MD, [facility] [phone] (pager) 04/28/2023 6:09 PM@

## 2023-04-29 ENCOUNTER — Encounter: Payer: Self-pay | Admitting: Internal Medicine

## 2023-04-29 ENCOUNTER — Ambulatory Visit: Payer: BC Managed Care – PPO | Admitting: Internal Medicine

## 2023-04-29 VITALS — BP 109/69 | HR 70 | Temp 98.0°F | Resp 12 | Ht 62.5 in | Wt 165.0 lb

## 2023-04-29 DIAGNOSIS — Z1211 Encounter for screening for malignant neoplasm of colon: Secondary | ICD-10-CM | POA: Diagnosis present

## 2023-04-29 DIAGNOSIS — D123 Benign neoplasm of transverse colon: Secondary | ICD-10-CM

## 2023-04-29 DIAGNOSIS — D125 Benign neoplasm of sigmoid colon: Secondary | ICD-10-CM

## 2023-04-29 MED ORDER — SODIUM CHLORIDE 0.9 % IV SOLN: 500.0000 mL | Freq: Once | INTRAVENOUS | Status: DC

## 2023-04-29 NOTE — Progress Notes (Signed)
Sedate, gd SR, tolerated procedure well, VSS, report to RN 

## 2023-04-29 NOTE — Progress Notes (Signed)
Called to room to assist during endoscopic procedure.  Patient ID and intended procedure confirmed with present staff. Received instructions for my participation in the procedure from the performing physician.  

## 2023-04-29 NOTE — Patient Instructions (Addendum)
I found and removed 3 polyps today.  I will let you know pathology results and when to have another routine colonoscopy by mail and/or My Chart.  I appreciate the opportunity to care for you. Iva Boop, MD, [facility]   No NSAIDS for 2 weeks: aspirin, aleve, ibuprofen.  Resume all o your regular medications today as ordered.  Read all of your discharge instructions.  YOU HAD AN ENDOSCOPIC PROCEDURE TODAY AT THE [facility]:   Refer to the procedure report that was given to you for any specific questions about what was found during the examination.  If the procedure report does not answer your questions, please call your gastroenterologist to clarify.  If you requested that your care partner not be given the details of your procedure findings, then the procedure report has been included in a sealed envelope for you to review at your convenience later.  YOU SHOULD EXPECT: Some feelings of bloating in the abdomen. Passage of more gas than usual.  Walking can help get rid of the air that was put into your GI tract during the procedure and reduce the bloating. If you had a lower endoscopy (such as a colonoscopy or flexible sigmoidoscopy) you may notice spotting of blood in your stool or on the toilet paper. If you underwent a bowel prep for your procedure, you may not have a normal bowel movement for a few days.  Please Note:  You might notice some irritation and congestion in your nose or some drainage.  This is from the oxygen used during your procedure.  There is no need for concern and it should clear up in a day or so.  SYMPTOMS TO REPORT IMMEDIATELY:  Following lower endoscopy (colonoscopy or flexible sigmoidoscopy):  Excessive amounts of blood in the stool  Significant tenderness or worsening of abdominal pains  Swelling of the abdomen that is new, acute  Fever of 100F or higher   For urgent or emergent issues, a gastroenterologist can be reached at any hour by calling [phone]  [phone]. Do not use MyChart messaging for urgent concerns.    DIET:  We do recommend a small meal at first, but then you may proceed to your regular diet.  Drink plenty of fluids but you should avoid alcoholic beverages for 24 hours.  ACTIVITY:  You should plan to take it easy for the rest of today and you should NOT DRIVE or use heavy machinery until tomorrow (because of the sedation medicines used during the test).    FOLLOW UP: Our staff will call the number listed on your records the next business day following your procedure.  We will call around 7:15- 8:00 am to check on you and address any questions or concerns that you may have regarding the information given to you following your procedure. If we do not reach you, we will leave a message.     If any biopsies were taken you will be contacted by phone or by letter within the next 1-3 weeks.  Please call us at [phone] if you have not heard about the biopsies in 3 weeks.    SIGNATURES/CONFIDENTIALITY: You and/or your care partner have signed paperwork which will be entered into your electronic medical record.  These signatures attest to the fact that that the information above on your After Visit Summary has been reviewed and is understood.  Full responsibility of the confidentiality of this discharge information lies with you and/or your care-partner.

## 2023-04-29 NOTE — Op Note (Signed)
[facility] Patient Name: Diana Walsh Procedure Date: 04/29/2023 10:11 AM MRN: [id number] Endoscopist: Iva Boop , MD, [id number] Age: 57 Referring MD:  Date of Birth: 1965-07-28 Gender: Female Account #: [id number] Procedure:                Colonoscopy Indications:              Screening for colorectal malignant neoplasm, This                            is the patient's first colonoscopy Medicines:                Monitored Anesthesia Care Procedure:                Pre-Anesthesia Assessment:                           - Prior to the procedure, a History and Physical                            was performed, and patient medications and                            allergies were reviewed. The patient's tolerance of                            previous anesthesia was also reviewed. The risks                            and benefits of the procedure and the sedation                            options and risks were discussed with the patient.                            All questions were answered, and informed consent                            was obtained. Prior Anticoagulants: The patient has                            taken no anticoagulant or antiplatelet agents. ASA                            Grade Assessment: II - A patient with mild systemic                            disease. After reviewing the risks and benefits,                            the patient was deemed in satisfactory condition to                            undergo the procedure.  After obtaining informed consent, the colonoscope                            was passed under direct vision. Throughout the                            procedure, the patient's blood pressure, pulse, and                            oxygen saturations were monitored continuously. The                            CF [id number] #[id number] was introduced through the anus                            and advanced to  the the cecum, identified by                            appendiceal orifice and ileocecal valve. The                            colonoscopy was performed without difficulty. The                            patient tolerated the procedure well. The quality                            of the bowel preparation was excellent. The bowel                            preparation used was SUPREP via split dose                            instruction. The ileocecal valve, appendiceal                            orifice, and rectum were photographed. Scope In: 10:31:28 AM Scope Out: 10:49:11 AM Scope Withdrawal Time: 0 hours 14 minutes 51 seconds  Total Procedure Duration: 0 hours 17 minutes 43 seconds  Findings:                 The perianal and digital rectal examinations were                            normal.                           A 10 mm polyp was found in the distal sigmoid                            colon. The polyp was sessile. The polyp was removed                            with a hot snare. Resection and retrieval were  complete. Verification of patient identification                            for the specimen was done. Estimated blood loss:                            none.                           Two sessile polyps were found in the distal                            transverse colon. The polyps were diminutive in                            size. These polyps were removed with a cold snare.                            Resection and retrieval were complete. Verification                            of patient identification for the specimen was                            done. Estimated blood loss was minimal.                           The exam was otherwise without abnormality on                            direct and retroflexion views. Complications:            No immediate complications. Estimated Blood Loss:     Estimated blood loss was minimal. Impression:                - One 10 mm polyp in the distal sigmoid colon,                            removed with a hot snare. Resected and retrieved.                           - Two diminutive polyps in the distal transverse                            colon, removed with a cold snare. Resected and                            retrieved.                           - The examination was otherwise normal on direct                            and retroflexion views. Recommendation:           - Patient has a  contact number available for                            emergencies. The signs and symptoms of potential                            delayed complications were discussed with the                            patient. Return to normal activities tomorrow.                            Written discharge instructions were provided to the                            patient.                           - Resume previous diet.                           - Continue present medications.                           - No aspirin, ibuprofen, naproxen, or other                            non-steroidal anti-inflammatory drugs for 2 weeks                            after polyp removal. Iva Boop, MD 04/29/2023 10:56:10 AM This report has been signed electronically.

## 2023-04-29 NOTE — Progress Notes (Signed)
Pt's states no medical or surgical changes since previsit or office visit. 

## 2023-04-30 ENCOUNTER — Telehealth: Payer: Self-pay

## 2023-04-30 NOTE — Telephone Encounter (Signed)
  Follow up Call-     04/29/2023   10:02 AM  Call back number  Post procedure Call Back phone  # [phone]  Permission to leave phone message Yes     Patient questions:  Do you have a fever, pain , or abdominal swelling? No. Pain Score  0 *  Have you tolerated food without any problems? Yes.    Have you been able to return to your normal activities? Yes.    Do you have any questions about your discharge instructions: Diet   No. Medications  No. Follow up visit  No.  Do you have questions or concerns about your Care? No.  Actions: * If pain score is 4 or above: No action needed, pain <4.

## 2023-05-01 LAB — SURGICAL PATHOLOGY

## 2023-05-07 ENCOUNTER — Encounter: Payer: Self-pay | Admitting: Internal Medicine

## 2023-05-07 DIAGNOSIS — Z860101 Personal history of adenomatous and serrated colon polyps: Secondary | ICD-10-CM | POA: Insufficient documentation

## 2023-05-07 HISTORY — DX: Personal history of adenomatous and serrated colon polyps: Z86.0101

## 2023-05-08 ENCOUNTER — Ambulatory Visit
Admission: RE | Admit: 2023-05-08 | Discharge: 2023-05-08 | Disposition: A | Discharge status: Home / Self Care | Payer: BC Managed Care – PPO | Source: Ambulatory Visit | Attending: Internal Medicine | Admitting: Internal Medicine

## 2023-05-08 DIAGNOSIS — Z1231 Encounter for screening mammogram for malignant neoplasm of breast: Secondary | ICD-10-CM

## 2023-06-17 ENCOUNTER — Ambulatory Visit: Payer: Self-pay | Admitting: Internal Medicine

## 2023-06-22 ENCOUNTER — Encounter: Payer: Self-pay | Admitting: Internal Medicine

## 2023-06-22 ENCOUNTER — Ambulatory Visit (INDEPENDENT_AMBULATORY_CARE_PROVIDER_SITE_OTHER): Payer: 59 | Admitting: Internal Medicine

## 2023-06-22 VITALS — BP 120/84 | HR 76 | Temp 98.4°F | Wt 171.0 lb

## 2023-06-22 DIAGNOSIS — E785 Hyperlipidemia, unspecified: Secondary | ICD-10-CM | POA: Diagnosis not present

## 2023-06-22 DIAGNOSIS — E66811 Obesity, class 1: Secondary | ICD-10-CM

## 2023-06-22 DIAGNOSIS — Z6830 Body mass index (BMI) 30.0-30.9, adult: Secondary | ICD-10-CM

## 2023-06-22 DIAGNOSIS — E559 Vitamin D deficiency, unspecified: Secondary | ICD-10-CM

## 2023-06-22 DIAGNOSIS — Z23 Encounter for immunization: Secondary | ICD-10-CM | POA: Diagnosis not present

## 2023-06-22 LAB — LIPID PANEL
Cholesterol: 233 mg/dL — ABNORMAL HIGH (ref 0–200)
HDL: 55.5 mg/dL (ref >39.00)
LDL Cholesterol: 143 mg/dL — ABNORMAL HIGH (ref 0–99)
NonHDL: 177.39
Total CHOL/HDL Ratio: 4
Triglycerides: 174 mg/dL — ABNORMAL HIGH (ref 0.0–149.0)
VLDL: 34.8 mg/dL (ref 0.0–40.0)

## 2023-06-22 LAB — VITAMIN D 25 HYDROXY (VIT D DEFICIENCY, FRACTURES): VITD: 28.65 ng/mL — ABNORMAL LOW (ref 30.00–100.00)

## 2023-06-22 MED ORDER — TIRZEPATIDE-WEIGHT MANAGEMENT 2.5 MG/0.5ML ~~LOC~~ SOLN: 2.5000 mg | SUBCUTANEOUS | 0 refills | Status: AC

## 2023-06-22 NOTE — Progress Notes (Signed)
Established Patient Office Visit     CC/Reason for Visit: Follow-up chronic conditions, discuss weight loss  HPI: Diana Walsh is a 58 y.o. female who is coming in today for the above mentioned reasons. Past Medical History is significant for: Vitamin D deficiency, hyperlipidemia, generalized anxiety disorder.  She has been feeling well.  She is tolerating rosuvastatin well.  She would like to discuss strategies for weight loss.   Past Medical/Surgical History: Past Medical History:  Diagnosis Date   Encounter for insertion of Mirena IUD 12/14/2017   Hx of adenomatous colonic polyps 05/07/2023   Hyperlipidemia    Kidney stone    LGSIL (low grade squamous intraepithelial dysplasia) 1990   normal paps since    Past Surgical History:  Procedure Laterality Date   BREAST SURGERY     Reduction   COLPOSCOPY  1990   EYE SURGERY     Bilateral   INTRAUTERINE DEVICE INSERTION  12/14/2017   Mirena   REDUCTION MAMMAPLASTY Bilateral     Social History:  reports that she has never smoked. She has never used smokeless tobacco. She reports current alcohol use. She reports that she does not use drugs.  Allergies: Allergies  Allergen Reactions   Penicillins Nausea And Vomiting    Has patient had a PCN reaction causing immediate rash, facial/tongue/throat swelling, SOB or lightheadedness with hypotension: No Has patient had a PCN reaction causing severe rash involving mucus membranes or skin necrosis: No Has patient had a PCN reaction that required hospitalization No Has patient had a PCN reaction occurring within the last 10 years: No If all of the above answers are "NO", then may proceed with Cephalosporin use.    Codeine Palpitations    Family History:  Family History  Problem Relation Age of Onset   Hypertension Father    Alzheimer's disease Maternal Grandmother    Heart disease Maternal Grandfather    Heart disease Paternal Grandfather    Hypertension Paternal  Grandfather    Colon cancer Neg Hx    Esophageal cancer Neg Hx    Colon polyps Neg Hx    Rectal cancer Neg Hx    Stomach cancer Neg Hx      Current Outpatient Medications:    citalopram (CELEXA) 10 MG tablet, Take 1 tablet (10 mg total) by mouth daily., Disp: 30 tablet, Rfl: 2   fluticasone (FLONASE) 50 MCG/ACT nasal spray, Place 2 sprays into both nostrils daily as needed for allergies or rhinitis., Disp: , Rfl:    levonorgestrel (MIRENA, 52 MG,) 20 MCG/24HR IUD, 1 Intra Uterine Device (1 each total) by Intrauterine route once for 1 dose. Inserted today in clinic, Disp: 1 each, Rfl: 0   loratadine (CLARITIN) 10 MG tablet, Take 1 tablet (10 mg total) by mouth daily as needed for allergies., Disp: 30 tablet, Rfl: 0   Multiple Vitamin (MULTIVITAMIN) tablet, Take 1 tablet by mouth daily. Reported on 09/05/2015, Disp: , Rfl:    rosuvastatin (CRESTOR) 5 MG tablet, Take 1 tablet (5 mg total) by mouth daily., Disp: 90 tablet, Rfl: 1   tirzepatide (ZEPBOUND) 2.5 MG/0.5ML injection vial, Inject 2.5 mg into the skin once a week., Disp: 2 mL, Rfl: 0  Review of Systems:  Negative unless indicated in HPI.   Physical Exam: Vitals:   06/22/23 0935  BP: 120/84  Pulse: 76  Temp: 98.4 F (36.9 C)  TempSrc: Oral  SpO2: 97%  Weight: 171 lb (77.6 kg)    Body mass index is  30.78 kg/m.   Physical Exam Vitals reviewed.  Constitutional:      Appearance: Normal appearance.  HENT:     Head: Normocephalic and atraumatic.  Eyes:     Conjunctiva/sclera: Conjunctivae normal.     Pupils: Pupils are equal, round, and reactive to light.  Cardiovascular:     Rate and Rhythm: Normal rate and regular rhythm.  Pulmonary:     Effort: Pulmonary effort is normal.     Breath sounds: Normal breath sounds.  Skin:    General: Skin is warm and dry.  Neurological:     General: No focal deficit present.     Mental Status: She is alert and oriented to person, place, and time.  Psychiatric:        Mood and  Affect: Mood normal.        Behavior: Behavior normal.        Thought Content: Thought content normal.        Judgment: Judgment normal.      Impression and Plan:  Need for shingles vaccine -     Varicella-zoster vaccine IM  Vitamin D deficiency -     VITAMIN D 25 Hydroxy (Vit-D Deficiency, Fractures)  Dyslipidemia -     Lipid panel  Obesity (BMI 30.0-34.9) -     Tirzepatide-Weight Management; Inject 2.5 mg into the skin once a week.  Dispense: 2 mL; Refill: 0   -Check vitamin D and lipids today. -Discussed healthy lifestyle, including increased physical activity and better food choices to promote weight loss. -In addition to incorporating exercise and increasing protein intake, we have agreed to try Zepbound.   Time spent:32 minutes reviewing chart, interviewing and examining patient and formulating plan of care.     Chaya Jan, MD [facility] at [facility]

## 2023-06-24 ENCOUNTER — Other Ambulatory Visit: Payer: Self-pay | Admitting: Internal Medicine

## 2023-06-24 ENCOUNTER — Encounter: Payer: Self-pay | Admitting: Internal Medicine

## 2023-06-24 DIAGNOSIS — E785 Hyperlipidemia, unspecified: Secondary | ICD-10-CM

## 2023-06-24 DIAGNOSIS — E559 Vitamin D deficiency, unspecified: Secondary | ICD-10-CM

## 2023-06-24 MED ORDER — ROSUVASTATIN CALCIUM 10 MG PO TABS: 10.0000 mg | ORAL_TABLET | Freq: Every day | ORAL | 1 refills | Status: DC

## 2023-06-24 MED ORDER — VITAMIN D (ERGOCALCIFEROL) 1.25 MG (50000 UNIT) PO CAPS: 50000.0000 [IU] | ORAL_CAPSULE | ORAL | 0 refills | Status: AC

## 2023-06-30 ENCOUNTER — Other Ambulatory Visit (HOSPITAL_COMMUNITY): Payer: Self-pay

## 2023-06-30 ENCOUNTER — Telehealth: Payer: Self-pay

## 2023-06-30 NOTE — Telephone Encounter (Signed)
*  Primary  Pharmacy Patient Advocate Encounter   Received notification from Patient Advice Request messages that prior authorization for Zepbound  2.5MG /0.5ML pen-injectors  is required/requested.   Insurance verification completed.   The patient is insured through [facility] .   Per test claim: The current 28 day co-pay is, $1,035.19.  No PA needed at this time. This test claim was processed through [facility]- copay amounts may vary at other pharmacies due to pharmacy/plan contracts, or as the patient moves through the different stages of their insurance plan.     *patient responsible for 100% of weight loss meds

## 2023-06-30 NOTE — Telephone Encounter (Signed)
PA request has been Submitted. New Encounter created for follow up. For additional info see Pharmacy Prior Auth telephone encounter from 02/04.

## 2023-07-14 NOTE — Telephone Encounter (Signed)
Pharmacy Patient Advocate Encounter  Received notification from [facility] that Prior Authorization for ZEPBOUND has been CANCELLED due to: Your PA has been resolved, no additional PA is required.

## 2023-10-26 ENCOUNTER — Encounter: Payer: Self-pay | Admitting: Internal Medicine

## 2024-02-22 ENCOUNTER — Encounter: Payer: Self-pay | Admitting: Internal Medicine

## 2024-02-22 ENCOUNTER — Ambulatory Visit: Admitting: Internal Medicine

## 2024-02-22 VITALS — BP 110/80 | HR 59 | Temp 98.5°F | Wt 150.9 lb

## 2024-02-22 DIAGNOSIS — E785 Hyperlipidemia, unspecified: Secondary | ICD-10-CM | POA: Diagnosis not present

## 2024-02-22 DIAGNOSIS — Z23 Encounter for immunization: Secondary | ICD-10-CM | POA: Diagnosis not present

## 2024-02-22 DIAGNOSIS — E559 Vitamin D deficiency, unspecified: Secondary | ICD-10-CM

## 2024-02-22 LAB — COMPREHENSIVE METABOLIC PANEL WITH GFR
ALT: 11 U/L (ref 0–35)
AST: 14 U/L (ref 0–37)
Albumin: 4.5 g/dL (ref 3.5–5.2)
Alkaline Phosphatase: 79 U/L (ref 39–117)
BUN: 14 mg/dL (ref 6–23)
CO2: 26 meq/L (ref 19–32)
Calcium: 9.7 mg/dL (ref 8.4–10.5)
Chloride: 105 meq/L (ref 96–112)
Creatinine, Ser: 0.79 mg/dL (ref 0.40–1.20)
GFR: 83.76 mL/min (ref >60.00)
Glucose, Bld: 101 mg/dL — ABNORMAL HIGH (ref 70–99)
Potassium: 4.1 meq/L (ref 3.5–5.1)
Sodium: 138 meq/L (ref 135–145)
Total Bilirubin: 0.4 mg/dL (ref 0.2–1.2)
Total Protein: 7.3 g/dL (ref 6.0–8.3)

## 2024-02-22 LAB — LIPID PANEL
Cholesterol: 191 mg/dL (ref 0–200)
HDL: 48.5 mg/dL (ref >39.00)
LDL Cholesterol: 124 mg/dL — ABNORMAL HIGH (ref 0–99)
NonHDL: 142.2
Total CHOL/HDL Ratio: 4
Triglycerides: 90 mg/dL (ref 0.0–149.0)
VLDL: 18 mg/dL (ref 0.0–40.0)

## 2024-02-22 LAB — VITAMIN D 25 HYDROXY (VIT D DEFICIENCY, FRACTURES): VITD: 17.93 ng/mL — ABNORMAL LOW (ref 30.00–100.00)

## 2024-02-22 MED ORDER — ROSUVASTATIN CALCIUM 10 MG PO TABS: 10.0000 mg | ORAL_TABLET | Freq: Every day | ORAL | 1 refills | Status: AC

## 2024-02-22 NOTE — Progress Notes (Signed)
 Established Patient Office Visit     CC/Reason for Visit: Follow-up chronic conditions  HPI: Diana Walsh is a 58 y.o. female who is coming in today for the above mentioned reasons. Past Medical History is significant for: Hyperlipidemia, vitamin D  deficiency.  Has been feeling well.  Has been getting semaglutide through a compound pharmacy and has lost about 20 pounds since last seen in office.  She is feeling well, has no acute concerns or complaints.   Past Medical/Surgical History: Past Medical History:  Diagnosis Date   Encounter for insertion of Mirena  IUD 12/14/2017   Hx of adenomatous colonic polyps 05/07/2023   Hyperlipidemia    Kidney stone    LGSIL (low grade squamous intraepithelial dysplasia) 1990   normal paps since    Past Surgical History:  Procedure Laterality Date   BREAST SURGERY     Reduction   COLPOSCOPY  1990   EYE SURGERY     Bilateral   INTRAUTERINE DEVICE INSERTION  12/14/2017   Mirena    REDUCTION MAMMAPLASTY Bilateral     Social History:  reports that she has never smoked. She has never used smokeless tobacco. She reports current alcohol use. She reports that she does not use drugs.  Allergies: Allergies  Allergen Reactions   Penicillins Nausea And Vomiting    Has patient had a PCN reaction causing immediate rash, facial/tongue/throat swelling, SOB or lightheadedness with hypotension: No Has patient had a PCN reaction causing severe rash involving mucus membranes or skin necrosis: No Has patient had a PCN reaction that required hospitalization No Has patient had a PCN reaction occurring within the last 10 years: No If all of the above answers are NO, then may proceed with Cephalosporin use.    Codeine Palpitations    Family History:  Family History  Problem Relation Age of Onset   Hypertension Father    Alzheimer's disease Maternal Grandmother    Heart disease Maternal Grandfather    Heart disease Paternal Grandfather     Hypertension Paternal Grandfather    Colon cancer Neg Hx    Esophageal cancer Neg Hx    Colon polyps Neg Hx    Rectal cancer Neg Hx    Stomach cancer Neg Hx      Current Outpatient Medications:    citalopram  (CELEXA ) 10 MG tablet, Take 1 tablet (10 mg total) by mouth daily., Disp: 30 tablet, Rfl: 2   fluticasone  (FLONASE ) 50 MCG/ACT nasal spray, Place 2 sprays into both nostrils daily as needed for allergies or rhinitis., Disp: , Rfl:    levonorgestrel  (MIRENA , 52 MG,) 20 MCG/24HR IUD, 1 Intra Uterine Device (1 each total) by Intrauterine route once for 1 dose. Inserted today in clinic, Disp: 1 each, Rfl: 0   loratadine  (CLARITIN ) 10 MG tablet, Take 1 tablet (10 mg total) by mouth daily as needed for allergies., Disp: 30 tablet, Rfl: 0   Multiple Vitamin (MULTIVITAMIN) tablet, Take 1 tablet by mouth daily. Reported on 09/05/2015, Disp: , Rfl:    tirzepatide  (ZEPBOUND ) 2.5 MG/0.5ML injection vial, Inject 2.5 mg into the skin once a week., Disp: 2 mL, Rfl: 0   rosuvastatin  (CRESTOR ) 10 MG tablet, Take 1 tablet (10 mg total) by mouth daily., Disp: 90 tablet, Rfl: 1  Review of Systems:  Negative unless indicated in HPI.   Physical Exam: Vitals:   02/22/24 0736  BP: 110/80  Pulse: (!) 59  Temp: 98.5 F (36.9 C)  TempSrc: Oral  SpO2: 98%  Weight: 150 lb  14.4 oz (68.4 kg)    Body mass index is 27.16 kg/m.    Impression and Plan:  Vitamin D  deficiency -     VITAMIN D  25 Hydroxy (Vit-D Deficiency, Fractures); Future  Dyslipidemia -     Rosuvastatin  Calcium ; Take 1 tablet (10 mg total) by mouth daily.  Dispense: 90 tablet; Refill: 1 -     Lipid panel; Future -     Comprehensive metabolic panel with GFR; Future  Immunization due   - Flu vaccine administered in office today. - Check lipid panel and liver function test, refill rosuvastatin , return in 6 months for annual preventive exam. - Check vitamin D  levels.  Time spent:31 minutes reviewing chart, interviewing and  examining patient and formulating plan of care.     Tully Theophilus Andrews, MD [facility] at [facility]

## 2024-02-22 NOTE — Addendum Note (Signed)
 Addended by: KATHRYNE MILLMAN B on: 02/22/2024 08:21 AM   Modules accepted: Orders

## 2024-02-23 ENCOUNTER — Ambulatory Visit: Payer: Self-pay | Admitting: Internal Medicine

## 2024-02-23 DIAGNOSIS — E559 Vitamin D deficiency, unspecified: Secondary | ICD-10-CM

## 2024-02-23 MED ORDER — VITAMIN D (ERGOCALCIFEROL) 1.25 MG (50000 UNIT) PO CAPS: 50000.0000 [IU] | ORAL_CAPSULE | ORAL | 0 refills | Status: AC

## 2024-08-23 ENCOUNTER — Encounter: Admitting: Internal Medicine

## 2024-08-24 ENCOUNTER — Encounter: Admitting: Internal Medicine
